# Patient Record
Sex: Male | Born: 1965 | Race: White | Hispanic: No | Marital: Married | Smoking: Never smoker
Health system: Southern US, Community
[De-identification: ages and names within clinical notes are randomized; demographics above are authoritative.]

## PROBLEM LIST (undated history)

## (undated) DIAGNOSIS — I5022 Chronic systolic (congestive) heart failure: Secondary | ICD-10-CM

## (undated) DIAGNOSIS — F32A Depression, unspecified: Secondary | ICD-10-CM

## (undated) DIAGNOSIS — C801 Malignant (primary) neoplasm, unspecified: Secondary | ICD-10-CM

## (undated) DIAGNOSIS — J449 Chronic obstructive pulmonary disease, unspecified: Secondary | ICD-10-CM

## (undated) DIAGNOSIS — G4730 Sleep apnea, unspecified: Secondary | ICD-10-CM

## (undated) DIAGNOSIS — I10 Essential (primary) hypertension: Secondary | ICD-10-CM

## (undated) DIAGNOSIS — E669 Obesity, unspecified: Secondary | ICD-10-CM

## (undated) DIAGNOSIS — I4891 Unspecified atrial fibrillation: Secondary | ICD-10-CM

## (undated) DIAGNOSIS — F329 Major depressive disorder, single episode, unspecified: Secondary | ICD-10-CM

## (undated) DIAGNOSIS — K602 Anal fissure, unspecified: Secondary | ICD-10-CM

## (undated) DIAGNOSIS — J45909 Unspecified asthma, uncomplicated: Secondary | ICD-10-CM

## (undated) DIAGNOSIS — Z8719 Personal history of other diseases of the digestive system: Secondary | ICD-10-CM

## (undated) DIAGNOSIS — F419 Anxiety disorder, unspecified: Secondary | ICD-10-CM

## (undated) DIAGNOSIS — K219 Gastro-esophageal reflux disease without esophagitis: Secondary | ICD-10-CM

## (undated) DIAGNOSIS — Z87442 Personal history of urinary calculi: Secondary | ICD-10-CM

## (undated) HISTORY — PX: CIRCUMCISION: SUR203

## (undated) HISTORY — PX: OTHER SURGICAL HISTORY: SHX169

## (undated) HISTORY — DX: Anal fissure, unspecified: K60.2

## (undated) HISTORY — PX: APPENDECTOMY: SHX54

## (undated) HISTORY — PX: DG ARTHRO THUMB*L*: HXRAD205

## (undated) HISTORY — DX: Major depressive disorder, single episode, unspecified: F32.9

## (undated) HISTORY — DX: Depression, unspecified: F32.A

## (undated) HISTORY — DX: Chronic systolic (congestive) heart failure: I50.22

## (undated) HISTORY — DX: Essential (primary) hypertension: I10

## (undated) HISTORY — DX: Obesity, unspecified: E66.9

---

## 2002-01-05 ENCOUNTER — Encounter: Payer: Self-pay | Admitting: Family Medicine

## 2002-01-05 ENCOUNTER — Ambulatory Visit (HOSPITAL_COMMUNITY): Admission: RE | Admit: 2002-01-05 | Discharge: 2002-01-05 | Payer: Self-pay | Admitting: Family Medicine

## 2002-02-17 ENCOUNTER — Inpatient Hospital Stay (HOSPITAL_COMMUNITY): Admission: RE | Admit: 2002-02-17 | Discharge: 2002-02-18 | Payer: Self-pay | Admitting: Neurosurgery

## 2002-02-17 ENCOUNTER — Encounter: Payer: Self-pay | Admitting: Neurosurgery

## 2003-10-11 ENCOUNTER — Ambulatory Visit (HOSPITAL_COMMUNITY): Admission: RE | Admit: 2003-10-11 | Discharge: 2003-10-11 | Payer: Self-pay | Admitting: Family Medicine

## 2003-12-27 ENCOUNTER — Emergency Department (HOSPITAL_COMMUNITY): Admission: EM | Admit: 2003-12-27 | Discharge: 2003-12-27 | Payer: Self-pay | Admitting: Emergency Medicine

## 2005-04-26 ENCOUNTER — Encounter (INDEPENDENT_AMBULATORY_CARE_PROVIDER_SITE_OTHER): Payer: Self-pay | Admitting: Specialist

## 2005-04-26 ENCOUNTER — Ambulatory Visit (HOSPITAL_BASED_OUTPATIENT_CLINIC_OR_DEPARTMENT_OTHER): Admission: RE | Admit: 2005-04-26 | Discharge: 2005-04-26 | Payer: Self-pay | Admitting: *Deleted

## 2006-05-21 ENCOUNTER — Ambulatory Visit: Payer: Self-pay | Admitting: Gastroenterology

## 2008-05-05 ENCOUNTER — Ambulatory Visit (HOSPITAL_COMMUNITY): Admission: RE | Admit: 2008-05-05 | Discharge: 2008-05-05 | Payer: Self-pay | Admitting: Family Medicine

## 2010-07-20 NOTE — Op Note (Signed)
[name], [name]                           ACCOUNT NO.:  [id number]   MEDICAL RECORD NO.:  [id number]                   PATIENT TYPE:  INP   LOCATION:  3012                                 FACILITY:  [facility]   PHYSICIAN:  Hewitt Shorts, M.D.            DATE OF BIRTH:  [birth year]   DATE OF PROCEDURE:  02/17/2002  DATE OF DISCHARGE:                                 OPERATIVE REPORT   PREOPERATIVE DIAGNOSES:  C5-6 cervical disk herniation, spondylosis,  degenerative disk disease, and radiculopathy.   POSTOPERATIVE DIAGNOSES:  C5-6 cervical disk herniation, spondylosis,  degenerative disk disease, and radiculopathy.   PROCEDURE:  C5-6 anterior cervical diskectomy and arthrodesis with allograft  and Trinica cervical plating.   SURGEON:  Hewitt Shorts, M.D.   ASSISTANT:  Coletta Memos, M.D.   ANESTHESIA:  General endotracheal.   INDICATIONS:  The patient is a 45 year old man who presented with neck pain  and radiculopathy.  He was found by MRI scan to have a C5-6 cervical disk  herniation superimposed upon an underlying spondylosis and degenerative disk  disease.  A decision was made to proceed with elective anterior cervical  diskectomy and arthrodesis.   DESCRIPTION OF PROCEDURE:  Patient brought to the operating room and placed  under general endotracheal anesthesia.  The patient was placed in 10 pounds  of Holter traction and the neck was prepped with Betadine soap and solution  and draped in a sterile fashion.  A horizontal incision was made in the left  side of the neck.  The line of the incision was infiltrated with local  anesthetic with epinephrine, and then an incision was made in a horizontal  fashion and carried down through the subcutaneous tissue and platysma.  Dissection was then carried out through an avascular plane, leaving the  sternocleidomastoid, carotid artery, and jugular vein laterally and the  trachea and esophagus medially.  The ventral aspect  of the vertebral column  was identified and a localizing x-ray was taken and the C5-6 intervertebral  disk space identified.  Diskectomy was begun with incision of the annulus  and continued with microcurettes and pituitary rongeurs.  The cartilaginous  end plates of the corresponding vertebrae were removed using microcurettes  as well as the Micro-Max drill.  The microscope was then draped and brought  into the field to provide additional magnification, illumination, and  visualization, and the remainder of the procedure was performed using  microdissection and microsurgical technique.  Posterior osteophytic  overgrowth was removed using the Micro-Max drill as well as the 2 mm  Kerrison punch with the thin foot plate.  The posterior longitudinal  ligament was opened and the disk herniation encountered and was removed.  Decompression was extended laterally into the foramina and the thecal sac  and nerve roots were decompressed bilaterally.  Once the diskectomy was  completed, hemostasis was established with the use of Gelfoam soaked in  thrombin, and then we proceeded with the arthrodesis.  We selected an 8 mm  graft of unicortical patellar allograft.  It was positioned in the  intervertebral disk space and counter sunk.  The cervical traction was  discontinued, and then we selected a 22 mm Trinica cervical plate.  It was  positioned over the fusion construct and secured to each of the vertebrae  with a pair of 4.2 x 14 mm screws.  Each screw hole was drilled and tapped  and the screws placed.  They were placed in alternating fashion.  They were  all fully tightened, and then the locking system was secured.  The wound was  irrigated with bacitracin solution and checked for hemostasis, which was  established and confirmed.  An x-ray was taken, which showed the graft,  plate, and screws to be in good position, and then we proceeded with  closure.  The platysma was closed with interrupted,  inverted 2-0 undyed  Vicryl suture, the subcutaneous and subcuticular layer were closed with  interrupted, inverted 3-0 undyed Vicryl sutures, and the skin was  reapproximated with Dermabond.  The patient tolerated the procedure well.  The estimated blood loss was 75 cc.  Sponge, instrument, and needle count  were correct.  Following surgery, the patient was placed in a soft cervical  collar, reversed from the anesthetic, to be extubated and transferred to  recovery for further care.                                               Hewitt Shorts, M.D.    RWN/MEDQ  D:  02/17/2002  T:  02/18/2002  Job:  [id number]

## 2010-07-20 NOTE — Op Note (Signed)
[name], [name]               ACCOUNT NO.:  [id number]   MEDICAL RECORD NO.:  [id number]          PATIENT TYPE:  AMB   LOCATION:  DSC                          FACILITY:  [facility]   PHYSICIAN:  Tennis Must Meyerdierks, M.D.DATE OF BIRTH:  [birth year]   DATE OF PROCEDURE:  04/26/2005  DATE OF DISCHARGE:                                 OPERATIVE REPORT   PREOPERATIVE DIAGNOSIS:  Mass left thumb.   POSTOPERATIVE DIAGNOSIS:  Mass left thumb.   PROCEDURE:  Excision of mass left thumb.   SURGEON:  Lowell Bouton, M.D.   ANESTHESIA:  General.   OPERATIVE FINDINGS:  The patient had a very large mass that involved the  entire pulp of the thumb.  It was adherent to the periosteum but did not  invade the bone.  The radial digital nerve was encased in the mass.  Ulnar  digital nerve was not involved.   PROCEDURE:  Under general anesthesia with a tourniquet on the left arm, the  left hand was prepped and draped in usual fashion and after exsanguinating  the limb, the tourniquet was inflated to 250 mmHg.  A V-shaped incision was  made over the pulp of the thumb and was radially based.  Sharp dissection  was carried down through the subcutaneous tissues to the mass.  The mass was  bluntly dissected out and was found to be adherent to the distal phalanx.  It was completely excised and the radial digital nerve appeared to enter the  mass.  It was beyond the trifurcation, however.  Ulnar digital nerve was not  involved and the tendon was not involved. After completely excising the  mass, there was a soft tissue defect in the thumb.  This was irrigated with  saline and the skin was closed with 4-0 nylon sutures.  0.5% Marcaine  digital block was inserted for pain control.  Sterile dressings were  applied.  The patient went to the recovery room awake and stable condition.      Lowell Bouton, M.D.  Electronically Signed     EMM/MEDQ  D:  04/26/2005  T:  04/26/2005   Job:  [id number]

## 2011-10-18 ENCOUNTER — Encounter: Payer: Self-pay | Admitting: Urgent Care

## 2011-10-18 ENCOUNTER — Ambulatory Visit (INDEPENDENT_AMBULATORY_CARE_PROVIDER_SITE_OTHER): Payer: Managed Care, Other (non HMO) | Admitting: Urgent Care

## 2011-10-18 VITALS — BP 121/79 | HR 68 | Temp 97.3°F | Ht 75.0 in | Wt 266.0 lb

## 2011-10-18 DIAGNOSIS — K921 Melena: Secondary | ICD-10-CM | POA: Insufficient documentation

## 2011-10-18 DIAGNOSIS — K6289 Other specified diseases of anus and rectum: Secondary | ICD-10-CM | POA: Insufficient documentation

## 2011-10-18 MED ORDER — PEG-KCL-NACL-NASULF-NA ASC-C 100 G PO SOLR: 1.0000 | ORAL | Status: DC

## 2011-10-18 NOTE — Progress Notes (Signed)
Referring Provider: Dr Phillips Odor Primary Care Physician:  Kirk Ruths, MD Primary Gastroenterologist:  Dr. Jonette Eva  Chief Complaint  Patient presents with  . Rectal Pain    HPI:  [name] is a 46 y.o. male here as a referral from Dr. Phillips Odor for Proctalgia. He was initially evaluated by Dr. Duncan Dull fields in 2008. At that point time is felt to have an anal fissure and treated with Anusol and sitz baths, as was given treatment for his constipation.  He tells me that his large stools & constipation seem to have resolved, however he still has severe pressure & proctalgia.  He has been having sharp pain if he sits for a long period of time.  The pain is relieved by getting up.  C/o 1 episode bright red blood in stool after straining with a bowel movement.   Denies mucus in his stools.  Denies any upper GI symptoms including heartburn, indigestion, nausea, vomiting, dysphagia, odynophagia or anorexia.  He has noticed some ulcers in his mouth .  Weight stable.  He takes a rare Naproxen for right arm pain.  He's never had colonoscopy.  Past Medical History  Diagnosis Date  . Hypertension   . Depression   . Anal fissure     Past Surgical History  Procedure Date  . Appendectomy   . Circumcision     Current Outpatient Prescriptions  Medication Sig Dispense Refill  . ALPRAZolam (XANAX) 0.5 MG tablet Take 0.5 mg by mouth at bedtime as needed.      Marland Kitchen aspirin 81 MG tablet Take 81 mg by mouth daily.      Marland Kitchen lisinopril (PRINIVIL,ZESTRIL) 10 MG tablet Take 10 mg by mouth daily.       . naproxen (NAPROSYN) 500 MG tablet Take 500 mg by mouth as needed.        Allergies as of 10/18/2011  . (No Known Allergies)    Family History  Problem Relation Age of Onset  . Liver disease Brother     ? etiology    History   Social History  . Marital Status: Married    Spouse Name: N/A    Number of Children: 2  . Years of Education: N/A   Occupational History  . Manufacturer    Social  History Main Topics  . Smoking status: Never Smoker   . Smokeless tobacco: Not on file  . Alcohol Use: Yes     socially, coupled rinks/yr  . Drug Use: No  . Sexually Active: Not on file   Review of Systems: Gen: Denies any fever, chills, sweats, anorexia, fatigue, weakness, malaise, weight loss, and sleep disorder CV: Denies chest pain, angina, palpitations, syncope, orthopnea, PND, peripheral edema, and claudication. Resp: Denies dyspnea at rest, dyspnea with exercise, cough, sputum, wheezing, coughing up blood, and pleurisy. GI: Denies vomiting blood, jaundice, and fecal incontinence.    GU : Denies urinary burning, blood in urine, urinary frequency, urinary hesitancy, nocturnal urination, and urinary incontinence. MS: Denies joint pain, limitation of movement, and swelling, stiffness, low back pain, extremity pain. Denies muscle weakness, cramps, atrophy.  Derm: Denies rash, itching, dry skin, hives, moles, warts, or unhealing ulcers.  Psych: Denies depression, anxiety, memory loss, suicidal ideation, hallucinations, paranoia, and confusion. Heme: Denies bruising, bleeding, and enlarged lymph nodes. Neuro:  Denies any headaches, dizziness, paresthesias. Endo:  Denies any problems with DM, thyroid, adrenal function.  Physical Exam: BP 121/79  Pulse 68  Temp 97.3 F (36.3 C) (Temporal)  Ht  6\' 3"  (1.905 m)  Wt 266 lb (120.657 kg)  BMI 33.25 kg/m2 No LMP for male patient. General:   Alert,  Well-developed, obese, pleasant and cooperative in NAD. Head:  Normocephalic and atraumatic. Eyes:  Sclera clear, no icterus.   Conjunctiva pink. Ears:  Normal auditory acuity. Nose:  No deformity, discharge, or lesions. Mouth:  No deformity or lesions,oropharynx pink & moist. Neck:  Supple; no masses or thyromegaly. Lungs:  Clear throughout to auscultation.   No wheezes, crackles, or rhonchi. No acute distress. Heart:  Regular rate and rhythm; no murmurs, clicks, rubs,  or gallops. Abdomen:   Normal bowel sounds.  No bruits.  Soft, non-tender and non-distended without masses, hepatosplenomegaly or hernias noted.  No guarding or rebound tenderness.   Rectal:  Deferred until colonoscopy. Msk:  Symmetrical without gross deformities. Normal posture. Pulses:  Normal pulses noted. Extremities:  No clubbing or edema. Neurologic:  Alert and oriented x4;  grossly normal neurologically. Skin:  Intact without significant lesions or rashes. Lymph Nodes:  No significant cervical adenopathy. Psych:  Alert and cooperative. Normal mood and affect.

## 2011-10-18 NOTE — Assessment & Plan Note (Signed)
[name] is a pleasant 46 y.o. 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  Constipation has resolved however he continues to have pain with defecation and has new onset hematochezia. Colonoscopy for evaluation to determine etiology of pain and bleeding. Differentials include benign anorectal source such as hemorrhoids or fissure, IBD, polyp or cancer.

## 2011-10-18 NOTE — Progress Notes (Signed)
Faxed to PCP

## 2011-10-18 NOTE — Patient Instructions (Addendum)
Colonoscopy with Dr Fields  

## 2011-10-18 NOTE — Assessment & Plan Note (Signed)
See proctalgia

## 2011-11-07 ENCOUNTER — Encounter (HOSPITAL_COMMUNITY): Payer: Self-pay | Admitting: Pharmacy Technician

## 2011-11-21 ENCOUNTER — Telehealth: Payer: Self-pay | Admitting: Gastroenterology

## 2011-11-21 ENCOUNTER — Ambulatory Visit (HOSPITAL_COMMUNITY)
Admission: RE | Admit: 2011-11-21 | Discharge: 2011-11-21 | Disposition: A | Discharge status: Home / Self Care | Payer: Managed Care, Other (non HMO) | Source: Ambulatory Visit | Attending: Gastroenterology | Admitting: Gastroenterology

## 2011-11-21 ENCOUNTER — Other Ambulatory Visit: Payer: Self-pay | Admitting: Gastroenterology

## 2011-11-21 ENCOUNTER — Encounter (HOSPITAL_COMMUNITY)
Admission: RE | Disposition: A | Discharge status: Home / Self Care | Payer: Self-pay | Source: Ambulatory Visit | Attending: Gastroenterology

## 2011-11-21 ENCOUNTER — Encounter (HOSPITAL_COMMUNITY): Payer: Self-pay | Admitting: *Deleted

## 2011-11-21 DIAGNOSIS — K644 Residual hemorrhoidal skin tags: Secondary | ICD-10-CM | POA: Insufficient documentation

## 2011-11-21 DIAGNOSIS — K625 Hemorrhage of anus and rectum: Secondary | ICD-10-CM

## 2011-11-21 DIAGNOSIS — Z5309 Procedure and treatment not carried out because of other contraindication: Secondary | ICD-10-CM | POA: Insufficient documentation

## 2011-11-21 DIAGNOSIS — K649 Unspecified hemorrhoids: Secondary | ICD-10-CM

## 2011-11-21 DIAGNOSIS — K6289 Other specified diseases of anus and rectum: Secondary | ICD-10-CM

## 2011-11-21 DIAGNOSIS — K921 Melena: Secondary | ICD-10-CM

## 2011-11-21 DIAGNOSIS — K5730 Diverticulosis of large intestine without perforation or abscess without bleeding: Secondary | ICD-10-CM | POA: Insufficient documentation

## 2011-11-21 DIAGNOSIS — I10 Essential (primary) hypertension: Secondary | ICD-10-CM | POA: Insufficient documentation

## 2011-11-21 HISTORY — PX: COLONOSCOPY: SHX5424

## 2011-11-21 LAB — BASIC METABOLIC PANEL
BUN: 10 mg/dL (ref 6–23)
CO2: 27 mEq/L (ref 19–32)
Calcium: 9.1 mg/dL (ref 8.4–10.5)
Chloride: 103 mEq/L (ref 96–112)
Creatinine, Ser: 0.93 mg/dL (ref 0.50–1.35)
GFR calc Af Amer: >90 mL/min (ref >90)
GFR calc non Af Amer: >90 mL/min (ref >90)
Glucose, Bld: 101 mg/dL — ABNORMAL HIGH (ref 70–99)
Potassium: 4.4 mEq/L (ref 3.5–5.1)
Sodium: 137 mEq/L (ref 135–145)

## 2011-11-21 LAB — MAGNESIUM: Magnesium: 2.1 mg/dL (ref 1.5–2.5)

## 2011-11-21 SURGERY — COLONOSCOPY
Anesthesia: Moderate Sedation

## 2011-11-21 MED ORDER — FLUMAZENIL 0.5 MG/5ML IV SOLN
INTRAVENOUS | Status: DC | PRN
  Administered 2011-11-21: .5 mg via INTRAVENOUS

## 2011-11-21 MED ORDER — MIDAZOLAM HCL 5 MG/5ML IJ SOLN
INTRAMUSCULAR | Status: AC
  Filled 2011-11-21: qty 10

## 2011-11-21 MED ORDER — FLUMAZENIL 0.5 MG/5ML IV SOLN
INTRAVENOUS | Status: AC
  Filled 2011-11-21: qty 5

## 2011-11-21 MED ORDER — ATROPINE SULFATE 1 MG/ML IJ SOLN
INTRAMUSCULAR | Status: AC
  Filled 2011-11-21: qty 1

## 2011-11-21 MED ORDER — MEPERIDINE HCL 100 MG/ML IJ SOLN
INTRAMUSCULAR | Status: AC
  Filled 2011-11-21: qty 1

## 2011-11-21 MED ORDER — MEPERIDINE HCL 100 MG/ML IJ SOLN
INTRAMUSCULAR | Status: DC | PRN
  Administered 2011-11-21: 50 mg via INTRAVENOUS
  Administered 2011-11-21 (×2): 25 mg via INTRAVENOUS
  Administered 2011-11-21: 50 mg via INTRAVENOUS

## 2011-11-21 MED ORDER — SODIUM CHLORIDE 0.45 % IV SOLN
INTRAVENOUS | Status: DC
  Administered 2011-11-21: 1000 mL via INTRAVENOUS

## 2011-11-21 MED ORDER — STERILE WATER FOR IRRIGATION IR SOLN
Status: DC | PRN
  Administered 2011-11-21: 10:00:00

## 2011-11-21 MED ORDER — PEG-KCL-NACL-NASULF-NA ASC-C 100 G PO SOLR: 1.0000 | ORAL | Status: DC

## 2011-11-21 MED ORDER — MIDAZOLAM HCL 5 MG/5ML IJ SOLN
INTRAMUSCULAR | Status: DC | PRN
  Administered 2011-11-21 (×2): 1 mg via INTRAVENOUS
  Administered 2011-11-21 (×2): 2 mg via INTRAVENOUS

## 2011-11-21 MED ORDER — ATROPINE SULFATE 1 MG/ML IJ SOLN
INTRAMUSCULAR | Status: DC | PRN
  Administered 2011-11-21: 1 mg via INTRAVENOUS

## 2011-11-21 NOTE — Telephone Encounter (Signed)
Message copied by Glendora Score on Thu Nov 21, 2011  2:08 PM ------      Message from: [place]      Created: Thu Nov 21, 2011 10:25 AM       PT NEEDS TCS WITH PROPOFOL DUE TO RECTAL BLEEDING-FAILED CONSCIOUS SEDATION, HR 30S REQUIRING ATROPINE AND NARCAN

## 2011-11-21 NOTE — Telephone Encounter (Signed)
Patient is rescheduled for TCS w/Propofol on Tuesday Oct 8th and instructions have been mailed to the patient and he is aware

## 2011-11-21 NOTE — Telephone Encounter (Signed)
REVIEWED.  

## 2011-11-21 NOTE — H&P (Signed)
`   Primary Care Physician:  MCGOUGH,WILLIAM M, MD Primary Gastroenterologist:  Dr. Deja Pisarski  Pre-Procedure History & Physical: HPI:  [name] is a 46 y.o. male here for  BRBPR.    Past Medical History  Diagnosis Date  . Hypertension   . Depression   . Anal fissure     Past Surgical History  Procedure Date  . Appendectomy   . Circumcision   . Anterior cervical disectomy     Prior to Admission medications   Medication Sig Start Date End Date Taking? Authorizing Provider  ALPRAZolam (XANAX) 0.5 MG tablet Take 0.5 mg by mouth at bedtime as needed. For sleep   Yes Historical Provider, MD  aspirin 81 MG tablet Take 81 mg by mouth daily.   Yes Historical Provider, MD  lisinopril (PRINIVIL,ZESTRIL) 10 MG tablet Take 10 mg by mouth daily.  10/13/11  Yes Historical Provider, MD  naproxen (NAPROSYN) 500 MG tablet Take 500 mg by mouth daily as needed. For inflammation   Yes Historical Provider, MD    Allergies as of 10/18/2011  . (No Known Allergies)    Family History  Problem Relation Age of Onset  . Liver disease Brother     ? etiology    History   Social History  . Marital Status: Married    Spouse Name: N/A    Number of Children: 2  . Years of Education: N/A   Occupational History  . Manufacturer    Social History Main Topics  . Smoking status: Never Smoker   . Smokeless tobacco: Not on file  . Alcohol Use: Yes     socially, coupled rinks/yr  . Drug Use: No  . Sexually Active: Not on file   Other Topics Concern  . Not on file   Social History Narrative  . No narrative on file    Review of Systems: See HPI, otherwise negative ROS   Physical Exam: BP 131/82  Pulse 57  Temp 98.6 F (37 C) (Oral)  Resp 13  Ht 6' 3" (1.905 m)  Wt 264 lb (119.75 kg)  BMI 33.00 kg/m2  SpO2 96% General:   Alert,  pleasant and cooperative in NAD Head:  Normocephalic and atraumatic. Neck:  Supple; Lungs:  Clear throughout to auscultation.    Heart:  Regular rate  and rhythm. Abdomen:  Soft, nontender and nondistended. Normal bowel sounds, without guarding, and without rebound.   Neurologic:  Alert and  oriented x4;  grossly normal neurologically.  Impression/Plan:    BRBPR  PLAN: TCS TODAY  

## 2011-11-21 NOTE — Op Note (Addendum)
[facility] [address] [place], [address]   COLONOSCOPY PROCEDURE REPORT  PATIENT: [name], [name]  MR#: [id number] BIRTHDATE: 1965-06-01 , 45  yrs. old GENDER: Male ENDOSCOPIST: Jonette Eva, MD REFERRED [id number] Regino Schultze, M.D. PROCEDURE DATE:  11/21/2011 PROCEDURE:   Colonoscopy, incomplete INDICATIONS:rectal bleeding. MEDICATIONS: Demerol 150 mg IV, Versed 6 mg IV, Atropine 1 mg IV, and Naloxone (Narcan) 0.4 mg IV  DESCRIPTION OF PROCEDURE:    Physical exam was performed.  Informed consent was obtained from the patient after explaining the benefits, risks, and alternatives to procedure.  The patient was connected to monitor and placed in left lateral position. Continuous oxygen was provided by nasal cannula and IV medicine administered through an indwelling cannula.  After administration of sedation and rectal exam, the patients rectum was intubated and the Pentax Colonoscope [phone]  colonoscope was advanced under direct visualization to the MID-TRANSVERSE COLON.  The scope was removed slowly by carefully examining the color, texture, anatomy, and integrity mucosa on the way out.  The patient was recovered in endoscopy and discharged home in satisfactory condition.       COLON FINDINGS: Mild diverticulosis was noted in the sigmoid colon. , The colon mucosa was otherwise normal.  , and Moderate sized hemorrhoids were found.  PREP QUALITY: excellent. CECAL W/D TIME:  COMPLICATIONS: None  ENDOSCOPIC IMPRESSION: 1.   Mild diverticulosis was noted in the sigmoid colon 2.   The colon mucosa was otherwise normal 3.   Moderate sized hemorrhoids-MOST LIKELY CAUSE FOR RECTAL BLEEDING   RECOMMENDATIONS: 1.  HIGH FIBER DIET PREP H PRN BMP/MG TODAY TCS IN WITIHN1 MONTH WITH PROPOFOL.  PT FAILED CONSCIOUS SEDATION DUE TO SEVERE SINUS BRADYCARDIA. 2.  HIGH FIBER DIET PREP H PRN BMP/MG TODAY TCS IN WITIHN1 MONTH WITH PROPOFOL.  PT FAILED CONSCIOUS  SEDATION DUE TO SEVER SINSU BRADYCARDIA.   LABS: SEP 2013 K 4.4 MG 2.1 CR 0.93    _______________________________ Rosalie DoctorJonette Eva, MD 11/21/2011 11:52 AM Revised: 11/21/2011 11:52 AM    PATIENT NAME:  [name], [name] MR#: [id number]

## 2011-11-21 NOTE — Progress Notes (Signed)
Dr Darrick Penna in to assess pt. States lab work is normal. Pt can be discharged now.

## 2011-11-25 ENCOUNTER — Encounter (HOSPITAL_COMMUNITY): Payer: Self-pay | Admitting: Gastroenterology

## 2011-12-02 ENCOUNTER — Encounter (HOSPITAL_COMMUNITY)
Admission: RE | Admit: 2011-12-02 | Discharge: 2011-12-02 | Disposition: A | Discharge status: Home / Self Care | Payer: Managed Care, Other (non HMO) | Source: Ambulatory Visit | Attending: Gastroenterology | Admitting: Gastroenterology

## 2011-12-02 ENCOUNTER — Encounter (HOSPITAL_COMMUNITY): Payer: Self-pay

## 2011-12-02 ENCOUNTER — Encounter (HOSPITAL_COMMUNITY): Payer: Self-pay | Admitting: Pharmacy Technician

## 2011-12-02 ENCOUNTER — Other Ambulatory Visit: Payer: Self-pay

## 2011-12-02 HISTORY — DX: Personal history of other diseases of the digestive system: Z87.19

## 2011-12-02 HISTORY — DX: Gastro-esophageal reflux disease without esophagitis: K21.9

## 2011-12-02 HISTORY — DX: Anxiety disorder, unspecified: F41.9

## 2011-12-02 LAB — HEMOGLOBIN AND HEMATOCRIT, BLOOD
HCT: 41.5 % (ref 39.0–52.0)
Hemoglobin: 14.1 g/dL (ref 13.0–17.0)

## 2011-12-02 NOTE — Patient Instructions (Addendum)
20 [name]  12/02/2011   Your procedure is scheduled on:   12/10/2011  Report to [facility] at  615  AM.  Call this number if you have problems the morning of surgery: [phone]   Remember:   Do not eat food:After Midnight.  May have clear liquids:until Midnight .    Take these medicines the morning of surgery with A SIP OF WATER:  Xanax,lisinopril   Do not wear jewelry, make-up or nail polish.  Do not wear lotions, powders, or perfumes. You may wear deodorant.  Do not shave 48 hours prior to surgery. Men may shave face and neck.  Do not bring valuables to the hospital.  Contacts, dentures or bridgework may not be worn into surgery.  Leave suitcase in the car. After surgery it may be brought to your room.  For patients admitted to the hospital, checkout time is 11:00 AM the day of discharge.   Patients discharged the day of surgery will not be allowed to drive home.  Name and phone number of your driver: family  Special Instructions: N/A   Please read over the following fact sheets that you were given: Pain Booklet, MRSA Information, Surgical Site Infection Prevention, Anesthesia Post-op Instructions and Care and Recovery After Surgery  Colonoscopy A colonoscopy is an exam to evaluate your entire colon. In this exam, your colon is cleansed. A long fiberoptic tube is inserted through your rectum and into your colon. The fiberoptic scope (endoscope) is a long bundle of enclosed and very flexible fibers. These fibers transmit light to the area examined and send images from that area to your caregiver. Discomfort is usually minimal. You may be given a drug to help you sleep (sedative) during or prior to the procedure. This exam helps to detect lumps (tumors), polyps, inflammation, and areas of bleeding. Your caregiver may also take a small piece of tissue (biopsy) that will be examined under a microscope. LET YOUR CAREGIVER KNOW ABOUT:   Allergies to food or medicine.    Medicines taken, including vitamins, herbs, eyedrops, over-the-counter medicines, and creams.   Use of steroids (by mouth or creams).   Previous problems with anesthetics or numbing medicines.   History of bleeding problems or blood clots.   Previous surgery.   Other health problems, including diabetes and kidney problems.   Possibility of pregnancy, if this applies.  BEFORE THE PROCEDURE   A clear liquid diet may be required for 2 days before the exam.   Ask your caregiver about changing or stopping your regular medications.   Liquid injections (enemas) or laxatives may be required.   A large amount of electrolyte solution may be given to you to drink over a short period of time. This solution is used to clean out your colon.   You should be present 60 minutes prior to your procedure or as directed by your caregiver.  AFTER THE PROCEDURE   If you received a sedative or pain relieving medication, you will need to arrange for someone to drive you home.   Occasionally, there is a little blood passed with the first bowel movement. Do not be concerned.  FINDING OUT THE RESULTS OF YOUR TEST Not all test results are available during your visit. If your test results are not back during the visit, make an appointment with your caregiver to find out the results. Do not assume everything is normal if you have not heard from your caregiver or the medical facility. It is  important for you to follow up on all of your test results. HOME CARE INSTRUCTIONS   It is not unusual to pass moderate amounts of gas and experience mild abdominal cramping following the procedure. This is due to air being used to inflate your colon during the exam. Walking or a warm pack on your belly (abdomen) may help.   You may resume all normal meals and activities after sedatives and medicines have worn off.   Only take over-the-counter or prescription medicines for pain, discomfort, or fever as directed by your  caregiver. Do not use aspirin or blood thinners if a biopsy was taken. Consult your caregiver for medicine usage if biopsies were taken.  SEEK IMMEDIATE MEDICAL CARE IF:   You have a fever.   You pass large blood clots or fill a toilet with blood following the procedure. This may also occur 10 to 14 days following the procedure. This is more likely if a biopsy was taken.   You develop abdominal pain that keeps getting worse and cannot be relieved with medicine.  Document Released: 02/16/2000 Document Revised: 02/07/2011 Document Reviewed: 10/01/2007 [facility] Patient Information 2012 [place], [place].PATIENT INSTRUCTIONS POST-ANESTHESIA  IMMEDIATELY FOLLOWING SURGERY:  Do not drive or operate machinery for the first twenty four hours after surgery.  Do not make any important decisions for twenty four hours after surgery or while taking narcotic pain medications or sedatives.  If you develop intractable nausea and vomiting or a severe headache please notify your doctor immediately.  FOLLOW-UP:  Please make an appointment with your surgeon as instructed. You do not need to follow up with anesthesia unless specifically instructed to do so.  WOUND CARE INSTRUCTIONS (if applicable):  Keep a dry clean dressing on the anesthesia/puncture wound site if there is drainage.  Once the wound has quit draining you may leave it open to air.  Generally you should leave the bandage intact for twenty four hours unless there is drainage.  If the epidural site drains for more than 36-48 hours please call the anesthesia department.  QUESTIONS?:  Please feel free to call your physician or the hospital operator if you have any questions, and they will be happy to assist you.

## 2011-12-04 ENCOUNTER — Other Ambulatory Visit (HOSPITAL_COMMUNITY): Payer: Managed Care, Other (non HMO)

## 2011-12-10 ENCOUNTER — Ambulatory Visit (HOSPITAL_COMMUNITY): Payer: Managed Care, Other (non HMO) | Admitting: Anesthesiology

## 2011-12-10 ENCOUNTER — Encounter (HOSPITAL_COMMUNITY)
Admission: RE | Disposition: A | Discharge status: Home / Self Care | Payer: Self-pay | Source: Ambulatory Visit | Attending: Gastroenterology

## 2011-12-10 ENCOUNTER — Ambulatory Visit (HOSPITAL_COMMUNITY)
Admission: RE | Admit: 2011-12-10 | Discharge: 2011-12-10 | Disposition: A | Discharge status: Home / Self Care | Payer: Managed Care, Other (non HMO) | Source: Ambulatory Visit | Attending: Gastroenterology | Admitting: Gastroenterology

## 2011-12-10 ENCOUNTER — Encounter (HOSPITAL_COMMUNITY): Payer: Self-pay | Admitting: *Deleted

## 2011-12-10 ENCOUNTER — Encounter (HOSPITAL_COMMUNITY): Payer: Self-pay | Admitting: Anesthesiology

## 2011-12-10 DIAGNOSIS — K648 Other hemorrhoids: Secondary | ICD-10-CM | POA: Insufficient documentation

## 2011-12-10 DIAGNOSIS — Z79899 Other long term (current) drug therapy: Secondary | ICD-10-CM | POA: Insufficient documentation

## 2011-12-10 DIAGNOSIS — K625 Hemorrhage of anus and rectum: Secondary | ICD-10-CM

## 2011-12-10 DIAGNOSIS — I10 Essential (primary) hypertension: Secondary | ICD-10-CM | POA: Insufficient documentation

## 2011-12-10 DIAGNOSIS — Z01810 Encounter for preprocedural cardiovascular examination: Secondary | ICD-10-CM | POA: Insufficient documentation

## 2011-12-10 DIAGNOSIS — Z01812 Encounter for preprocedural laboratory examination: Secondary | ICD-10-CM | POA: Insufficient documentation

## 2011-12-10 SURGERY — COLONOSCOPY WITH PROPOFOL
Anesthesia: Monitor Anesthesia Care

## 2011-12-10 MED ORDER — LIDOCAINE HCL (PF) 1 % IJ SOLN
INTRAMUSCULAR | Status: AC
  Filled 2011-12-10: qty 5

## 2011-12-10 MED ORDER — MIDAZOLAM HCL 5 MG/5ML IJ SOLN
INTRAMUSCULAR | Status: DC | PRN
  Administered 2011-12-10: 2 mg via INTRAVENOUS

## 2011-12-10 MED ORDER — PROPOFOL 10 MG/ML IV EMUL
INTRAVENOUS | Status: AC
  Filled 2011-12-10: qty 20

## 2011-12-10 MED ORDER — MIDAZOLAM HCL 2 MG/2ML IJ SOLN
INTRAMUSCULAR | Status: AC
  Filled 2011-12-10: qty 2

## 2011-12-10 MED ORDER — WATER FOR IRRIGATION, STERILE IR SOLN
Status: DC | PRN
  Administered 2011-12-10: 1000 mL

## 2011-12-10 MED ORDER — GLYCOPYRROLATE 0.2 MG/ML IJ SOLN
0.4000 mg | Freq: Once | INTRAMUSCULAR | Status: AC
  Administered 2011-12-10: 0.4 mg via INTRAVENOUS

## 2011-12-10 MED ORDER — ONDANSETRON HCL 4 MG/2ML IJ SOLN: 4.0000 mg | Freq: Once | INTRAMUSCULAR | Status: DC | PRN

## 2011-12-10 MED ORDER — HYDROCORTISONE ACETATE 25 MG RE SUPP: 25.0000 mg | Freq: Two times a day (BID) | RECTAL | Status: DC

## 2011-12-10 MED ORDER — LACTATED RINGERS IV SOLN: INTRAVENOUS | Status: DC

## 2011-12-10 MED ORDER — ATROPINE SULFATE 1 MG/ML IJ SOLN
INTRAMUSCULAR | Status: AC
  Filled 2011-12-10: qty 1

## 2011-12-10 MED ORDER — MIDAZOLAM HCL 2 MG/2ML IJ SOLN
1.0000 mg | INTRAMUSCULAR | Status: DC | PRN
  Administered 2011-12-10: 2 mg via INTRAVENOUS

## 2011-12-10 MED ORDER — ATROPINE SULFATE 0.4 MG/ML IJ SOLN
INTRAMUSCULAR | Status: DC | PRN
  Administered 2011-12-10: 1 mg via INTRAVENOUS

## 2011-12-10 MED ORDER — FENTANYL CITRATE 0.05 MG/ML IJ SOLN
INTRAMUSCULAR | Status: DC | PRN
  Administered 2011-12-10 (×4): 25 ug via INTRAVENOUS

## 2011-12-10 MED ORDER — STERILE WATER FOR IRRIGATION IR SOLN
Status: DC | PRN
  Administered 2011-12-10: 08:00:00

## 2011-12-10 MED ORDER — PROPOFOL INFUSION 10 MG/ML OPTIME
INTRAVENOUS | Status: DC | PRN
  Administered 2011-12-10: 75 ug/kg/min via INTRAVENOUS

## 2011-12-10 MED ORDER — LACTATED RINGERS IV SOLN
INTRAVENOUS | Status: DC | PRN
  Administered 2011-12-10: 07:00:00 via INTRAVENOUS

## 2011-12-10 MED ORDER — GLYCOPYRROLATE 0.2 MG/ML IJ SOLN
INTRAMUSCULAR | Status: AC
  Filled 2011-12-10: qty 1

## 2011-12-10 MED ORDER — FENTANYL CITRATE 0.05 MG/ML IJ SOLN
INTRAMUSCULAR | Status: AC
  Filled 2011-12-10: qty 2

## 2011-12-10 MED ORDER — ONDANSETRON HCL 4 MG/2ML IJ SOLN
INTRAMUSCULAR | Status: AC
  Filled 2011-12-10: qty 2

## 2011-12-10 MED ORDER — ONDANSETRON HCL 4 MG/2ML IJ SOLN
4.0000 mg | Freq: Once | INTRAMUSCULAR | Status: AC
  Administered 2011-12-10: 4 mg via INTRAVENOUS

## 2011-12-10 MED ORDER — FENTANYL CITRATE 0.05 MG/ML IJ SOLN: 25.0000 ug | INTRAMUSCULAR | Status: DC | PRN

## 2011-12-10 MED ORDER — LACTATED RINGERS IV SOLN
INTRAVENOUS | Status: DC
  Administered 2011-12-10: 50 mL via INTRAVENOUS

## 2011-12-10 MED ORDER — LIDOCAINE HCL (CARDIAC) 10 MG/ML IV SOLN
INTRAVENOUS | Status: DC | PRN
  Administered 2011-12-10: 50 mg via INTRAVENOUS

## 2011-12-10 SURGICAL SUPPLY — 21 items
ELECT REM PT RETURN 9FT ADLT (ELECTROSURGICAL) ×2
ELECTRODE REM PT RTRN 9FT ADLT (ELECTROSURGICAL) ×1 IMPLANT
FCP BXJMBJMB 240X2.8X (CUTTING FORCEPS)
FLOOR PAD 36X40 (MISCELLANEOUS) ×2
FORCEPS BIOP RAD 4 LRG CAP 4 (CUTTING FORCEPS) IMPLANT
FORCEPS BIOP RJ4 240 W/NDL (CUTTING FORCEPS)
FORCEPS BXJMBJMB 240X2.8X (CUTTING FORCEPS) IMPLANT
INJECTOR/SNARE I SNARE (MISCELLANEOUS) IMPLANT
LUBRICANT JELLY 4.5OZ STERILE (MISCELLANEOUS) ×2 IMPLANT
MANIFOLD NEPTUNE II (INSTRUMENTS) ×2 IMPLANT
NEEDLE SCLEROTHERAPY 25GX240 (NEEDLE) IMPLANT
PAD FLOOR 36X40 (MISCELLANEOUS) ×1 IMPLANT
PROBE APC STR FIRE (PROBE) IMPLANT
PROBE INJECTION GOLD (MISCELLANEOUS)
PROBE INJECTION GOLD 7FR (MISCELLANEOUS) IMPLANT
SNARE SHORT THROW 13M SML OVAL (MISCELLANEOUS) IMPLANT
SYR 50ML LL SCALE MARK (SYRINGE) ×2 IMPLANT
TRAP SPECIMEN MUCOUS 40CC (MISCELLANEOUS) IMPLANT
TUBING ENDO SMARTCAP PENTAX (MISCELLANEOUS) ×2 IMPLANT
TUBING IRRIGATION ENDOGATOR (MISCELLANEOUS) ×2 IMPLANT
WATER STERILE IRR 1000ML POUR (IV SOLUTION) ×4 IMPLANT

## 2011-12-10 NOTE — Interval H&P Note (Signed)
History and Physical Interval Note:  12/10/2011 7:48 AM  [name]  has presented today for surgery, with the diagnosis of Rectal Bleeding  The various methods of treatment have been discussed with the patient and family. After consideration of risks, benefits and other options for treatment, the patient has consented to  Procedure(s) (LRB) with comments: COLONOSCOPY WITH PROPOFOL (N/A) as a surgical intervention .  The patient's history has been reviewed, patient examined, no change in status, stable for surgery.  I have reviewed the patient's chart and labs.  Questions were answered to the patient's satisfaction.     [employer]

## 2011-12-10 NOTE — Anesthesia Procedure Notes (Signed)
Procedure Name: MAC Date/Time: 12/10/2011 8:00 AM Performed by: Carolyne Littles, Mithran Strike L Pre-anesthesia Checklist: Patient identified, Timeout performed, Emergency Drugs available, Suction available and Patient being monitored Patient Re-evaluated:Patient Re-evaluated prior to inductionOxygen Delivery Method: Simple face mask

## 2011-12-10 NOTE — Anesthesia Preprocedure Evaluation (Signed)
Anesthesia Evaluation  Patient identified by MRN, date of birth, ID band Patient awake    Reviewed: Allergy & Precautions, H&P , NPO status , Patient's Chart, lab work & pertinent test results  Airway Mallampati: I      Dental  (+) Teeth Intact   Pulmonary neg pulmonary ROS,  breath sounds clear to auscultation        Cardiovascular hypertension, Pt. on medications Rhythm:Regular Rate:Bradycardia     Neuro/Psych PSYCHIATRIC DISORDERS Anxiety Depression    GI/Hepatic hiatal hernia,   Endo/Other    Renal/GU      Musculoskeletal   Abdominal   Peds  Hematology   Anesthesia Other Findings   Reproductive/Obstetrics                           Anesthesia Physical Anesthesia Plan  ASA: II  Anesthesia Plan: MAC   Post-op Pain Management:    Induction: Intravenous  Airway Management Planned: Simple Face Mask  Additional Equipment:   Intra-op Plan:   Post-operative Plan:   Informed Consent: I have reviewed the patients History and Physical, chart, labs and discussed the procedure including the risks, benefits and alternatives for the proposed anesthesia with the patient or authorized representative who has indicated his/her understanding and acceptance.     Plan Discussed with:   Anesthesia Plan Comments:         Anesthesia Quick Evaluation

## 2011-12-10 NOTE — Op Note (Signed)
[facility] [address] [place], [address]   COLONOSCOPY PROCEDURE REPORT  PATIENT: Leandro, Hulburt  MR#: [id number] BIRTHDATE: [birth year] , 45  yrs. old GENDER: Male ENDOSCOPIST: Jonette Eva, MD REFERRED MV:HQIONGE Regino Schultze, M.D. PROCEDURE DATE:  12/10/2011 PROCEDURE:   Colonoscopy, screening INDICATIONS:rectal bleeding.  FREQUENTLY LIFTS HEAVY ITEMS AND DOES REPETATIVE TASKS. MEDICATIONS: MAC sedation, administered by CRNA and Atropine mg IV  DESCRIPTION OF PROCEDURE:    Physical exam was performed.  Informed consent was obtained from the patient after explaining the benefits, risks, and alternatives to procedure.  The patient was connected to monitor and placed in left lateral position. Continuous oxygen was provided by nasal cannula and IV medicine administered through an indwelling cannula.  After administration of sedation and rectal exam, the patients rectum was intubated and the     colonoscope was advanced under direct visualization to the cecum.  The scope was removed slowly by carefully examining the color, texture, anatomy, and integrity mucosa on the way out.  The patient was recovered in endoscopy and discharged home in satisfactory condition.       COLON FINDINGS: The colon was otherwise normal.  There was no diverticulosis, inflammation, polyps or cancers unless previously stated and Moderate sized internal hemorrhoids were found.  PREP QUALITY: good. CECAL W/D TIME: 14 minutes  COMPLICATIONS: None  ENDOSCOPIC IMPRESSION: 1.   The colon was otherwise normal 2.   Moderate sized internal hemorrhoids WHICH ARE THE CAUSE FOR RECTAL BLEEDING.   RECOMMENDATIONS: DRINK WATER TO KEEP URINE LIGHT YELLOW.  AVOID CONSTIPATION & HEAVY LIFTING.  FOLLOW A HIGH FIBER DIET.  AVOID ITEMS THAT CAUSE BLOATING.  USE PREPARATION H 2 TO 4 TIMES A DAY AS NEEDED FOR RECTAL PRESSURE/PAIN/BLEEDING.  USE COLACE AS NEEDED FOR HARD STOOL OR  CONSTIPATION.  Next colonoscopy in 10 years. WITH AN OVERTUBE AND PROPOFOL.       _______________________________ Rosalie DoctorJonette Eva, MD 12/10/2011 8:53 AM     PATIENT NAME:  Jaziyah, Fauble MR#: [id number]

## 2011-12-10 NOTE — Anesthesia Postprocedure Evaluation (Signed)
  Anesthesia Post-op Note  Patient: [name]  Procedure(s) Performed: Procedure(s) (LRB) with comments: COLONOSCOPY WITH PROPOFOL (N/A) - 0815 at cecum; withdrawal time=14 min  Patient Location: PACU  Anesthesia Type: MAC  Level of Consciousness: awake, alert , oriented and patient cooperative  Airway and Oxygen Therapy: Patient Spontanous Breathing and Patient connected to face mask oxygen  Post-op Pain: mild  Post-op Assessment: Post-op Vital signs reviewed, Patient's Cardiovascular Status Stable, Respiratory Function Stable, Patent Airway and No signs of Nausea or vomiting  Post-op Vital Signs: Reviewed and stable  Complications: No apparent anesthesia complications  

## 2011-12-10 NOTE — H&P (View-Only) (Signed)
`   Primary Care Physician:  Kirk Ruths, MD Primary Gastroenterologist:  Dr. Darrick Penna  Pre-Procedure History & Physical: HPI:  [name] is a 46 y.o. male here for  BRBPR.    Past Medical History  Diagnosis Date  . Hypertension   . Depression   . Anal fissure     Past Surgical History  Procedure Date  . Appendectomy   . Circumcision   . Anterior cervical disectomy     Prior to Admission medications   Medication Sig Start Date End Date Taking? Authorizing Provider  ALPRAZolam Prudy Feeler) 0.5 MG tablet Take 0.5 mg by mouth at bedtime as needed. For sleep   Yes Historical Provider, MD  aspirin 81 MG tablet Take 81 mg by mouth daily.   Yes Historical Provider, MD  lisinopril (PRINIVIL,ZESTRIL) 10 MG tablet Take 10 mg by mouth daily.  10/13/11  Yes Historical Provider, MD  naproxen (NAPROSYN) 500 MG tablet Take 500 mg by mouth daily as needed. For inflammation   Yes Historical Provider, MD    Allergies as of 10/18/2011  . (No Known Allergies)    Family History  Problem Relation Age of Onset  . Liver disease Brother     ? etiology    History   Social History  . Marital Status: Married    Spouse Name: N/A    Number of Children: 2  . Years of Education: N/A   Occupational History  . Manufacturer    Social History Main Topics  . Smoking status: Never Smoker   . Smokeless tobacco: Not on file  . Alcohol Use: Yes     socially, coupled rinks/yr  . Drug Use: No  . Sexually Active: Not on file   Other Topics Concern  . Not on file   Social History Narrative  . No narrative on file    Review of Systems: See HPI, otherwise negative ROS   Physical Exam: BP 131/82  Pulse 57  Temp 98.6 F (37 C) (Oral)  Resp 13  Ht 6\' 3"  (1.905 m)  Wt 264 lb (119.75 kg)  BMI 33.00 kg/m2  SpO2 96% General:   Alert,  pleasant and cooperative in NAD Head:  Normocephalic and atraumatic. Neck:  Supple; Lungs:  Clear throughout to auscultation.    Heart:  Regular rate  and rhythm. Abdomen:  Soft, nontender and nondistended. Normal bowel sounds, without guarding, and without rebound.   Neurologic:  Alert and  oriented x4;  grossly normal neurologically.  Impression/Plan:    BRBPR  PLAN: TCS TODAY

## 2011-12-10 NOTE — Transfer of Care (Signed)
  Anesthesia Post-op Note  Patient: [name]  Procedure(s) Performed: Procedure(s) (LRB) with comments: COLONOSCOPY WITH PROPOFOL (N/A) - 0815 at cecum; withdrawal time=14 min  Patient Location: PACU  Anesthesia Type: MAC  Level of Consciousness: awake, alert , oriented and patient cooperative  Airway and Oxygen Therapy: Patient Spontanous Breathing and Patient connected to face mask oxygen  Post-op Pain: mild  Post-op Assessment: Post-op Vital signs reviewed, Patient's Cardiovascular Status Stable, Respiratory Function Stable, Patent Airway and No signs of Nausea or vomiting  Post-op Vital Signs: Reviewed and stable  Complications: No apparent anesthesia complications

## 2011-12-10 NOTE — Preoperative (Signed)
Beta Blockers   Reason not to administer Beta Blockers:Not Applicable 

## 2011-12-15 NOTE — Progress Notes (Signed)
TCS SEP 2013 I[facility]   REVIEWED.  

## 2012-04-02 ENCOUNTER — Ambulatory Visit (INDEPENDENT_AMBULATORY_CARE_PROVIDER_SITE_OTHER): Payer: Managed Care, Other (non HMO) | Admitting: Otolaryngology

## 2012-04-02 DIAGNOSIS — J351 Hypertrophy of tonsils: Secondary | ICD-10-CM

## 2012-04-02 DIAGNOSIS — J3501 Chronic tonsillitis: Secondary | ICD-10-CM

## 2012-04-02 DIAGNOSIS — H6120 Impacted cerumen, unspecified ear: Secondary | ICD-10-CM

## 2012-11-17 ENCOUNTER — Ambulatory Visit: Payer: Managed Care, Other (non HMO) | Admitting: Cardiovascular Disease

## 2012-11-20 ENCOUNTER — Ambulatory Visit: Payer: Managed Care, Other (non HMO) | Admitting: Cardiovascular Disease

## 2013-02-17 ENCOUNTER — Ambulatory Visit: Payer: Managed Care, Other (non HMO) | Admitting: Gastroenterology

## 2013-04-07 ENCOUNTER — Encounter: Payer: Self-pay | Admitting: Gastroenterology

## 2013-04-07 ENCOUNTER — Encounter (INDEPENDENT_AMBULATORY_CARE_PROVIDER_SITE_OTHER): Payer: Self-pay

## 2013-04-07 ENCOUNTER — Encounter: Payer: Self-pay | Admitting: Internal Medicine

## 2013-04-07 ENCOUNTER — Ambulatory Visit (INDEPENDENT_AMBULATORY_CARE_PROVIDER_SITE_OTHER): Payer: Managed Care, Other (non HMO) | Admitting: Gastroenterology

## 2013-04-07 ENCOUNTER — Other Ambulatory Visit: Payer: Self-pay | Admitting: Gastroenterology

## 2013-04-07 VITALS — BP 116/71 | HR 55 | Temp 98.0°F | Wt 283.0 lb

## 2013-04-07 DIAGNOSIS — K6289 Other specified diseases of anus and rectum: Secondary | ICD-10-CM

## 2013-04-07 DIAGNOSIS — K921 Melena: Secondary | ICD-10-CM

## 2013-04-07 DIAGNOSIS — K625 Hemorrhage of anus and rectum: Secondary | ICD-10-CM

## 2013-04-07 DIAGNOSIS — K5900 Constipation, unspecified: Secondary | ICD-10-CM | POA: Insufficient documentation

## 2013-04-07 MED ORDER — LINACLOTIDE 145 MCG PO CAPS: ORAL_CAPSULE | ORAL | Status: DC

## 2013-04-07 NOTE — Progress Notes (Signed)
Pt is aware of OV on 3/4 at 245 with SF and appt card was mailed

## 2013-04-07 NOTE — Assessment & Plan Note (Signed)
DRINK WATER EAT FIBER ADD LINZESS OPV IN 4 MOS

## 2013-04-07 NOTE — Assessment & Plan Note (Signed)
DUE TO HEMORRHOIDS.  FLEXSIG/IH BANDING FEB 6

## 2013-04-07 NOTE — Progress Notes (Signed)
   Subjective:    Patient ID: [name], male    DOB: March 11, 1965, 48 y.o.   MRN: [id number]  Leonides Grills, MD  HPI Has seen brbpr off and on. EASES UP AND THEN COMES BACK. TAKING STOOL SOFTENERS(EQUATE). MAY FEEL SOMETHING SLIPS OUT BUT GOES BACK IN. LIFTING LESS. SUBJECTIVE CHILLS. PAIN AFTER HE USES THE BR. PAIN LASTS 5-10 MINS(CUTTING). IN PROCESS OF PASSING STOOL HAS PAIN AND DISCOMFORT BUT MAINLY AFTER STOOLS PASSES.  BMs: EVERY 3-4 DAYS AND MAY BE TWICE IN A DAY. DEPENDS ON DIET. IF NO STOOL FEELS PRESSURE IN HIS RECTUM AND USU. A LARGE STOOL. NO SOILING. IF SITS FOR A LONG TIME CAN FEELS SWELLING IN ANAL CANAL. PT DENIES FEVER, CHILLS, nausea, vomiting, melena, diarrhea, abd pain, problems swallowing, problems with sedation, OR heartburn or indigestion.   Past Medical History  Diagnosis Date  . Hypertension   . Depression   . Anal fissure   . Anxiety   . H/O hiatal hernia   . GERD (gastroesophageal reflux disease)     Past Surgical History  Procedure Laterality Date  . Appendectomy    . Circumcision    . Anterior cervical disectomy    . Colonoscopy  11/21/2011    Procedure: COLONOSCOPY;  Surgeon: Danie Binder, MD;  Location: [facility];  Service: Endoscopy;  Laterality: N/A;  9:50  . Dg arthro thumb*l*      removal of cyst   No Known Allergies  Current Outpatient Prescriptions  Medication Sig Dispense Refill  . acetaminophen (TYLENOL) 500 MG tablet Take 1,000 mg by mouth every 6 (six) hours as needed. Pain      . ALPRAZolam (XANAX) 0.5 MG tablet Take 0.5 mg by mouth at bedtime as needed. For sleep      . aspirin 81 MG tablet Take 81 mg by mouth daily.      Marland Kitchen lisinopril (PRINIVIL,ZESTRIL) 10 MG tablet Take 10 mg by mouth daily.       . naproxen (NAPROSYN) 500 MG tablet Take 500 mg by mouth daily as needed. For inflammation      .          Review of Systems     Objective:   Physical Exam  Vitals reviewed. Constitutional: He is oriented to person,  place, and time. He appears well-nourished. No distress.  HENT:  Head: Normocephalic and atraumatic.  Mouth/Throat: Oropharynx is clear and moist. No oropharyngeal exudate.  Eyes: Pupils are equal, round, and reactive to light. No scleral icterus.  Neck: Normal range of motion. Neck supple.  Cardiovascular: Normal rate, regular rhythm and normal heart sounds.   Pulmonary/Chest: Effort normal and breath sounds normal. No respiratory distress.  Abdominal: Soft. Bowel sounds are normal. He exhibits no distension. There is no tenderness.  Musculoskeletal: He exhibits no edema.  Lymphadenopathy:    He has no cervical adenopathy.  Neurological: He is alert and oriented to person, place, and time.  NO FOCAL DEFICITS   Psychiatric: He has a normal mood and affect.          Assessment & Plan:

## 2013-04-07 NOTE — Progress Notes (Signed)
cc'd to pcp 

## 2013-04-07 NOTE — Assessment & Plan Note (Signed)
DUE TO HEMORRHOIDS & EXACERBATED BY CONSTIPATION.  ADD LINZESS DISCUSSED BENEFITS V. RISKS OF [facility] V. FSIG/IH BANDING INCLUDING PELVIC VEIN SEPSIS. PT ELECTED FSIG IH BANDING. SCHEDULE FEB 6. PHENERGAN 12.5 MG IV IN PREOP. OPV IN 3 WEEKS.

## 2013-04-07 NOTE — Patient Instructions (Signed)
FLEX SIG TO FIX HEMORRHOIDS FEB 6.  DRINK WATER TO KEEP YOUR URINE LIGHT YELLOW.  FOLLOW A HIGH FIBER DIET. SEE INFO BELOW.  TO TREAT CONSTIPATION, ADD LINZESS 30 MINS PRIOR TO BREAKFAST. IT MAY CAUSE EXPLOSIVE DIARRHEA.  FOLLOW UP IN 3 WEEKS TO RE-ASESSS HEMORRHOIDS.    HEMORRHOIDAL BANDING COMPLICATIONS:  COMMON: 1. MINOR PAIN  UNCOMMON: 1. ABSCESS 2. BAND FALLS OFF 3. PROLAPSE OF HEMORRHOIDS AND PAIN 4. ULCER BLEEDING  A. USUALLY SELF-LIMITED: MAY LAST 3-5 DAYS  B. MAY REQUIRE INTERVENTION: 1-2 WEEKS AFTER INTERACTIONS 5. NECROTIZING PELVIC SEPSIS(< %)  A. SYMPTOMS: FEVER, PAIN, DIFFICULTY URINATING  High-Fiber Diet A high-fiber diet changes your normal diet to include more whole grains, legumes, fruits, and vegetables. Changes in the diet involve replacing refined carbohydrates with unrefined foods. The calorie level of the diet is essentially unchanged. The Dietary Reference Intake (recommended amount) for adult males is 38 grams per day. For adult females, it is 25 grams per day. Pregnant and lactating women should consume 28 grams of fiber per day. Fiber is the intact part of a plant that is not broken down during digestion. Functional fiber is fiber that has been isolated from the plant to provide a beneficial effect in the body. PURPOSE  Increase stool bulk.   Ease and regulate bowel movements.   Lower cholesterol.  INDICATIONS THAT YOU NEED MORE FIBER  Constipation and hemorrhoids.   Uncomplicated diverticulosis (intestine condition) and irritable bowel syndrome.   Weight management.   As a protective measure against hardening of the arteries (atherosclerosis), diabetes, and cancer.   GUIDELINES FOR INCREASING FIBER IN THE DIET  Start adding fiber to the diet slowly. A gradual increase of about 5 more grams (2 slices of whole-wheat bread, 2 servings of most fruits or vegetables, or 1 bowl of high-fiber cereal) per day is best. Too rapid an increase in fiber  may result in constipation, flatulence, and bloating.   Drink enough water and fluids to keep your urine clear or pale yellow. Water, juice, or caffeine-free drinks are recommended. Not drinking enough fluid may cause constipation.   Eat a variety of high-fiber foods rather than one type of fiber.   Try to increase your intake of fiber through using high-fiber foods rather than fiber pills or supplements that contain small amounts of fiber.   The goal is to change the types of food eaten. Do not supplement your present diet with high-fiber foods, but replace foods in your present diet.  INCLUDE A VARIETY OF FIBER SOURCES  Replace refined and processed grains with whole grains, canned fruits with fresh fruits, and incorporate other fiber sources. White rice, white breads, and most bakery goods contain little or no fiber.   Brown whole-grain rice, buckwheat oats, and many fruits and vegetables are all good sources of fiber. These include: broccoli, Brussels sprouts, cabbage, cauliflower, beets, sweet potatoes, white potatoes (skin on), carrots, tomatoes, eggplant, squash, berries, fresh fruits, and dried fruits.   Cereals appear to be the richest source of fiber. Cereal fiber is found in whole grains and bran. Bran is the fiber-rich outer coat of cereal grain, which is largely removed in refining. In whole-grain cereals, the bran remains. In breakfast cereals, the largest amount of fiber is found in those with "bran" in their names. The fiber content is sometimes indicated on the label.   You may need to include additional fruits and vegetables each day.   In baking, for 1 cup white flour, you may  use the following substitutions:   1 cup whole-wheat flour minus 2 tablespoons.   1/2 cup white flour plus 1/2 cup whole-wheat flour.

## 2013-04-09 ENCOUNTER — Encounter (HOSPITAL_COMMUNITY): Payer: Self-pay | Admitting: Pharmacy Technician

## 2013-04-12 ENCOUNTER — Encounter (HOSPITAL_COMMUNITY): Payer: Self-pay | Admitting: *Deleted

## 2013-04-12 ENCOUNTER — Ambulatory Visit: Admit: 2013-04-12 | Payer: Self-pay | Admitting: Gastroenterology

## 2013-04-12 ENCOUNTER — Encounter (HOSPITAL_COMMUNITY)
Admission: RE | Disposition: A | Discharge status: Home / Self Care | Payer: Self-pay | Source: Ambulatory Visit | Attending: Gastroenterology

## 2013-04-12 ENCOUNTER — Ambulatory Visit (HOSPITAL_COMMUNITY)
Admission: RE | Admit: 2013-04-12 | Discharge: 2013-04-12 | Disposition: A | Discharge status: Home / Self Care | Payer: Managed Care, Other (non HMO) | Source: Ambulatory Visit | Attending: Gastroenterology | Admitting: Gastroenterology

## 2013-04-12 DIAGNOSIS — K625 Hemorrhage of anus and rectum: Secondary | ICD-10-CM

## 2013-04-12 DIAGNOSIS — K5900 Constipation, unspecified: Secondary | ICD-10-CM

## 2013-04-12 DIAGNOSIS — K648 Other hemorrhoids: Secondary | ICD-10-CM | POA: Insufficient documentation

## 2013-04-12 DIAGNOSIS — Z7982 Long term (current) use of aspirin: Secondary | ICD-10-CM | POA: Insufficient documentation

## 2013-04-12 DIAGNOSIS — K921 Melena: Secondary | ICD-10-CM

## 2013-04-12 DIAGNOSIS — Z79899 Other long term (current) drug therapy: Secondary | ICD-10-CM | POA: Insufficient documentation

## 2013-04-12 DIAGNOSIS — I10 Essential (primary) hypertension: Secondary | ICD-10-CM | POA: Insufficient documentation

## 2013-04-12 HISTORY — PX: FLEXIBLE SIGMOIDOSCOPY: SHX5431

## 2013-04-12 HISTORY — PX: HEMORRHOID BANDING: SHX5850

## 2013-04-12 SURGERY — SIGMOIDOSCOPY, FLEXIBLE
Anesthesia: Moderate Sedation

## 2013-04-12 MED ORDER — SODIUM CHLORIDE 0.9 % IV SOLN
INTRAVENOUS | Status: DC
  Administered 2013-04-12: 12:00:00 via INTRAVENOUS

## 2013-04-12 MED ORDER — MIDAZOLAM HCL 5 MG/5ML IJ SOLN
INTRAMUSCULAR | Status: DC | PRN
  Administered 2013-04-12 (×2): 2 mg via INTRAVENOUS

## 2013-04-12 MED ORDER — MEPERIDINE HCL 100 MG/ML IJ SOLN
INTRAMUSCULAR | Status: DC | PRN
  Administered 2013-04-12 (×2): 25 mg via INTRAVENOUS

## 2013-04-12 MED ORDER — MIDAZOLAM HCL 5 MG/5ML IJ SOLN
INTRAMUSCULAR | Status: AC
  Filled 2013-04-12: qty 10

## 2013-04-12 MED ORDER — SIMETHICONE 40 MG/0.6ML PO SUSP
ORAL | Status: DC | PRN
  Administered 2013-04-12: 12:00:00

## 2013-04-12 MED ORDER — ATROPINE SULFATE 1 MG/ML IJ SOLN
INTRAMUSCULAR | Status: AC
  Filled 2013-04-12: qty 1

## 2013-04-12 MED ORDER — ATROPINE SULFATE 1 MG/ML IJ SOLN
0.5000 mg | Freq: Once | INTRAMUSCULAR | Status: AC
  Administered 2013-04-12: 0.5 mg via INTRAVENOUS

## 2013-04-12 MED ORDER — LIDOCAINE HCL 2 % EX GEL
CUTANEOUS | Status: AC
  Filled 2013-04-12: qty 30

## 2013-04-12 MED ORDER — MEPERIDINE HCL 100 MG/ML IJ SOLN
INTRAMUSCULAR | Status: AC
  Filled 2013-04-12: qty 2

## 2013-04-12 MED ORDER — PROMETHAZINE HCL 25 MG/ML IJ SOLN
12.5000 mg | Freq: Once | INTRAMUSCULAR | Status: AC
  Administered 2013-04-12: 12.5 mg via INTRAVENOUS
  Filled 2013-04-12: qty 1

## 2013-04-12 MED ORDER — SODIUM CHLORIDE 0.9 % IJ SOLN
INTRAMUSCULAR | Status: AC
  Filled 2013-04-12: qty 10

## 2013-04-12 NOTE — Progress Notes (Signed)
Dr fields in to adjust hemorrhoid bands .  Pt felt nauseated and HR droped to 39 .  Atropine given.

## 2013-04-12 NOTE — Discharge Instructions (Signed)
HEMORRHOIDAL BANDING DISCHARGE INSTRUCTIONS ° °I PUT BANDS IN 3 AREAS ABOVE YOUR HEMORRHOIDS. CALL [phone] FOR FOR RECTAL BLEEDING, FEVER, PAIN, OR DIFFICULTY URINATING. YOU MAY SEE BLEEDING FOR 3 DAYS TO 2 WEEKS.  ° ° °HOME CARE INSTRUCTIONS ° °FOLLOWING YOUR PROCEDURE IT IS COMMON TO HAVE MINOR RECTAL DISCOMFORT OR PAIN. °1.  YOU may use ibuprofen 200 MG over-the-counter 2 every 6 hours as needed for mild rectal pain OR TYLENOL.   Take the ibuprofen with food and milk.  °2   Use sitz bath 3 times a day  AS NEEDED FOR RECTAL PAIN.  °3. Avoid straining to have bowel movements. °4. Keep anal area dry and clean.  °5. Do not use a donut shaped pillow or sit on the toilet for long periods. This increases blood pooling and pain.  °6. Move your bowels when your body has the urge; this will require less straining and will decrease pain and pressure.  °7. YOU MAY Add Colace 100 mg twice daily FOR 7 DAYS to soften the stool. HOLD FOR DIARRHEA. °8. FOLLOW A LOW RESIDUE DIET FOR 2 WEEKS. SEE INFO BELOW. °9. CONTINUE LINZESS. °10. FOLLOW UP IN 2-3WEEKS.  ° ° ° ° ° ° °SIGMOIDOSCOPY °Care After °Read the instructions outlined below and refer to this sheet in the next week. These discharge instructions provide you with general information on caring for yourself after you leave the hospital. While your treatment has been planned according to the most current medical practices available, unavoidable complications occasionally occur. If you have any problems or questions after discharge, call DR. FIELDS, [phone]. ° °ACTIVITY °· You may resume your regular activity, but move at a slower pace for the next 24 hours.  °· Take frequent rest periods for the next 24 hours.  °· Walking will help get rid of the air and reduce the bloated feeling in your belly (abdomen).  °· No driving for 24 hours (because of the medicine (anesthesia) used during the test).  °· You may shower.  °· Do not sign any important legal documents or operate  any machinery for 24 hours (because of the anesthesia used during the test).  °·  °NUTRITION °· Drink plenty of fluids.  °· You may resume your normal diet as instructed by your doctor.  °· Begin with a light meal and progress to your normal diet. Heavy or fried foods are harder to digest and may make you feel sick to your stomach (nauseated).  °· Avoid alcoholic beverages for 24 hours or as instructed.  °·  °MEDICATIONS °· You may resume your normal medications. °·  °WHAT YOU CAN EXPECT TODAY °· Some feelings of bloating in the abdomen.  °· Passage of more gas than usual.  °· Spotting of blood in your stool or on the toilet paper °· .  °IF YOU HAD POLYPS REMOVED DURING THE COLONOSCOPY: °· Eat a soft diet IF YOU HAVE NAUSEA, BLOATING, ABDOMINAL PAIN, OR VOMITING. °·   °FINDING OUT THE RESULTS OF YOUR TEST °Not all test results are available during your visit. DR. FIELDS WILL CALL YOU WITHIN 7 DAYS OF YOUR PROCEDUE WITH YOUR RESULTS. Do not assume everything is normal if you have not heard from DR. FIELDS IN ONE WEEK, CALL HER OFFICE AT [phone]. ° °SEEK IMMEDIATE MEDICAL ATTENTION AND CALL THE OFFICE: [phone] IF: °· You have more than a spotting of blood in your stool.  °· Your belly is swollen (abdominal distention).  °· You are nauseated or vomiting.  °·   You have a temperature over 101F.   You have abdominal pain or discomfort that is severe or gets worse throughout the day.    HEMORRHOIDAL BANDING COMPLICATIONS:  COMMON: 1. MINOR PAIN  UNCOMMON: 1. ABSCESS 2. BAND FALLS OFF 3. PROLAPSE OF HEMORRHOIDS AND PAIN 4. ULCER BLEEDING  A. USUALLY SELF-LIMITED: MAY LAST 3-5 DAYS  B. MAY REQUIRE INTERVENTION: 1-2 WEEKS AFTER INTERACTIONS 5. NECROTIZING PELVIC SEPSIS  A. SYMPTOMS: FEVER, PAIN, DIFFICULTY URINATING   Low Fiber and Residue Restricted Diet A low fiber diet restricts foods that contain carbohydrates that are not digested in the small intestine. A diet containing about 10 g of  fiber is considered low fiber. The diet needs to be individualized to suit patient tolerances and preferences and to avoid unnecessary restrictions. Generally, the foods emphasized in a low fiber diet have no skins or seeds. They may have been processed to remove bran, germ, or husks. Cooking may not necessarily eliminate the fiber. Cooking may, in fact, enable a greater quantity of fiber to be consumed in a lesser volume. Legumes and nuts are also restricted. The term low residue has also been used to describe low fiber diets, although the two are not the same. Residue refers to any substance that adds to bowel (colonic) contents, such as sloughed cells and intestinal bacteria, in addition to fiber. Residue-containing foods, prunes and prune juice, milk, and connective tissue from meats may also need to be eliminated. It is important to eliminate these foods during sudden (acute) attacks of inflammatory bowel disease, when there is a partial obstruction due to another reason, or when minimal fecal output is desired. When these problems are gone, a more normal diet may be used.  PURPOSE  Reduce stool weight and volume.   CHOOSING FOODS Check labels, especially on foods from the starch list. Often, dietary fiber content is listed with the Nutrition Facts panel.  Breads and Starches  Allowed: White, [place], and pita breads, plain rolls, buns, or sweet rolls, doughnuts, waffles, pancakes, bagels. Plain muffins, sweet breads, biscuits, matzoth. Flour. Soda, saltine, or graham crackers. Pretzels, rusks, melba toast, zwieback. Cooked cereals: cornmeal, farina, cream cereals. Dry cereals: refined corn, wheat, rice, and oat cereals (check label). Potatoes prepared any way without skins, refined macaroni, spaghetti, noodles, refined rice.   Avoid: Bread, rolls, or crackers made with whole-wheat, multigrains, rye, bran seeds, nuts, or coconut. Corn tortillas, table-shells. Corn chips, tortilla chips. Cereals  containing whole-grains, multigrains, bran, coconut, nuts, or raisins. Cooked or dry oatmeal. Coarse wheat cereals, granola. Cereals advertised as "high fiber." Potato skins. Whole-grain pasta, wild or brown rice. Popcorn.  Vegetables  Allowed:  Strained tomato and vegetable juices. Fresh: tender lettuce, cucumber, cabbage, spinach, bean sprouts. Cooked, canned: asparagus, bean sprouts, cut green or wax beans, cauliflower, pumpkin, beets, mushrooms, olives, spinach, yellow squash, tomato, tomato sauce (no seeds), zucchini (peeled), turnips. Canned sweet potatoes. Small amounts of celery, onion, radish, and green pepper may be used. Keep servings limited to  cup.   Avoid: Fresh, cooked, or canned: artichokes, baked beans, beet greens, broccoli, Brussels sprouts, French-style green beans, corn, kale, legumes, peas, sweet potatoes. Cooked: green or red cabbage, spinach. Avoid large servings of any vegetables.  Fruit  Allowed:  All fruit juices except prune juice. Cooked or canned: apricots applesauce, cantaloupe, cherries, grapefruit, grapes, kiwi, mandarin oranges, peaches, pears, fruit cocktail, pineapple, plums, watermelon. Fresh: banana, grapes, cantaloupe, avocado, cherries, pineapple, grapefruit, kiwi, nectarines, peaches, oranges, blueberries, plums. Keep servings limited to  cup or  1 piece.  °· Avoid: Fresh: apple with or without skin, apricots, mango, pears, raspberries, strawberries. Prune juice, stewed or dried prunes. Dried fruits, raisins, dates. Avoid large servings of all fresh fruits.  °Meat and Meat Substitutes °· Allowed:  Ground or well-cooked tender beef, ham, veal, lamb, pork, or poultry. Eggs, plain cheese. Fish, oysters, shrimp, lobster, other seafood. Liver, organ meats.  °· Avoid: Tough, fibrous meats with gristle. Peanut butter, smooth or chunky. Cheese with seeds, nuts, or other foods not allowed. Nuts, seeds, legumes, dried peas, beans, lentils.  °Milk °· Allowed:  All milk  products except those not allowed. Milk and milk product consumption should be minimal when low residue is desired.  °· Avoid: Yogurt that contains nuts or seeds.  °Soups and Combination Foods °· Allowed:  Bouillon, broth, or cream soups made from allowed foods. Any strained soup. Casseroles or mixed dishes made with allowed foods.  °· Avoid: Soups made from vegetables that are not allowed or that contain other foods not allowed.  °Desserts and Sweets °· Allowed:  Plain cakes and cookies, pie made with allowed fruit, pudding, custard, cream pie. Gelatin, fruit, ice, sherbet, frozen ice pops. Ice cream, ice milk without nuts. Plain hard candy, honey, jelly, molasses, syrup, sugar, chocolate syrup, gumdrops, marshmallows.  °· Avoid: Desserts, cookies, or candies that contain nuts, peanut butter, or dried fruits. Jams, preserves with seeds, marmalade.  °Fats and Oils °· Allowed:  Margarine, butter, cream, mayonnaise, salad oils, plain salad dressings made from allowed foods. Plain gravy, crisp bacon without rind.  °· Avoid: Seeds, nuts, olives. Avocados.  °Beverages °· Allowed:  All, except those listed to avoid.  °· Avoid: Fruit juices with high pulp, prune juice.  °Condiments °· Allowed:  Ketchup, mustard, horseradish, vinegar, cream sauce, cheese sauce, cocoa powder. Spices in moderation: allspice, basil, bay leaves, celery powder or leaves, cinnamon, cumin powder, curry powder, ginger, mace, marjoram, onion or garlic powder, oregano, paprika, parsley flakes, ground pepper, rosemary, sage, savory, tarragon, thyme, turmeric.  °· Avoid: Coconut, pickles.  °SAMPLE MEAL PLAN °The following menu is provided as a sample. Your daily menu plans will vary. Be sure to include a minimum of the following each day in order to provide essential nutrients for the adult: °· Starch/Bread/Cereal Group, 6 servings.  °· Fruit/Vegetable Group, 5 servings.  °· Meat/Meat Substitute Group, 2 servings.  °· Milk/Milk Substitute Group, 2  servings.  °A serving is equal to ½ cup for fruits, vegetables, and cooked cereals or 1 piece for foods such as a piece of bread, 1 orange, or 1 apple. For dry cereals and crackers, use serving sizes listed on the label. Combination foods may count as full or partial servings from various food groups. Fats, desserts, and sweets may be added to the meal plan after the requirements for essential nutrients are met. °SAMPLE MENU °Breakfast °· ½ cup orange juice.  °· 1 boiled egg.  °· 1 slice white toast.  °· Margarine.  °· ¾ cup cornflakes.  °· 1 cup milk.  °· Beverage.  °Lunch °· ½ cup chicken noodle soup.  °· 2 to 3 oz sliced roast beef.  °· 2 slices seedless rye bread.  °· Mayonnaise.  °· ½ cup tomato juice.  °· 1 small banana.  °· Beverage.  °Dinner °· 3 oz baked chicken.  °· ½ cup scalloped potatoes.  °· ½ cup cooked beets.  °· White dinner roll.  °· Margarine.  °· ½ cup canned peaches.  °· Beverage.  °

## 2013-04-12 NOTE — Progress Notes (Signed)
Pt States pain has dropped from 10 to 7.  Dr Oneida Alar notified.

## 2013-04-12 NOTE — H&P (View-Only) (Signed)
   Subjective:    Patient ID: [name], male    DOB: 09/30/1965, 47 y.o.   MRN: [id number]  MCGOUGH,WILLIAM M, MD  HPI Has seen [facility] off and on. EASES UP AND THEN COMES BACK. TAKING STOOL SOFTENERS(EQUATE). MAY FEEL SOMETHING SLIPS OUT BUT GOES BACK IN. LIFTING LESS. SUBJECTIVE CHILLS. PAIN AFTER HE USES THE BR. PAIN LASTS 5-10 MINS(CUTTING). IN PROCESS OF PASSING STOOL HAS PAIN AND DISCOMFORT BUT MAINLY AFTER STOOLS PASSES.  BMs: EVERY 3-4 DAYS AND MAY BE TWICE IN A DAY. DEPENDS ON DIET. IF NO STOOL FEELS PRESSURE IN HIS RECTUM AND USU. A LARGE STOOL. NO SOILING. IF SITS FOR A LONG TIME CAN FEELS SWELLING IN ANAL CANAL. PT DENIES FEVER, CHILLS, nausea, vomiting, melena, diarrhea, abd pain, problems swallowing, problems with sedation, OR heartburn or indigestion.   Past Medical History  Diagnosis Date  . Hypertension   . Depression   . Anal fissure   . Anxiety   . H/O hiatal hernia   . GERD (gastroesophageal reflux disease)     Past Surgical History  Procedure Laterality Date  . Appendectomy    . Circumcision    . Anterior cervical disectomy    . Colonoscopy  11/21/2011    Procedure: COLONOSCOPY;  Surgeon: Niya Behler L Marilin Kofman, MD;  Location: [facility];  Service: Endoscopy;  Laterality: N/A;  9:50  . Dg arthro thumb*l*      removal of cyst   No Known Allergies  Current Outpatient Prescriptions  Medication Sig Dispense Refill  . acetaminophen (TYLENOL) 500 MG tablet Take 1,000 mg by mouth every 6 (six) hours as needed. Pain      . ALPRAZolam (XANAX) 0.5 MG tablet Take 0.5 mg by mouth at bedtime as needed. For sleep      . aspirin 81 MG tablet Take 81 mg by mouth daily.      . lisinopril (PRINIVIL,ZESTRIL) 10 MG tablet Take 10 mg by mouth daily.       . naproxen (NAPROSYN) 500 MG tablet Take 500 mg by mouth daily as needed. For inflammation      .          Review of Systems     Objective:   Physical Exam  Vitals reviewed. Constitutional: He is oriented to person,  place, and time. He appears well-nourished. No distress.  HENT:  Head: Normocephalic and atraumatic.  Mouth/Throat: Oropharynx is clear and moist. No oropharyngeal exudate.  Eyes: Pupils are equal, round, and reactive to light. No scleral icterus.  Neck: Normal range of motion. Neck supple.  Cardiovascular: Normal rate, regular rhythm and normal heart sounds.   Pulmonary/Chest: Effort normal and breath sounds normal. No respiratory distress.  Abdominal: Soft. Bowel sounds are normal. He exhibits no distension. There is no tenderness.  Musculoskeletal: He exhibits no edema.  Lymphadenopathy:    He has no cervical adenopathy.  Neurological: He is alert and oriented to person, place, and time.  NO FOCAL DEFICITS   Psychiatric: He has a normal mood and affect.          Assessment & Plan:   

## 2013-04-12 NOTE — Interval H&P Note (Signed)
History and Physical Interval Note:  04/12/2013 12:00 PM  [name]  has presented today for surgery, with the diagnosis of RECTAL BLEEDING  The various methods of treatment have been discussed with the patient and family. After consideration of risks, benefits and other options for treatment, the patient has consented to  Procedure(s) with comments: FLEXIBLE SIGMOIDOSCOPY (N/A) - 12:00 HEMORRHOID BANDING (N/A) as a surgical intervention .  The patient's history has been reviewed, patient examined, no change in status, stable for surgery.  I have reviewed the patient's chart and labs.  Questions were answered to the patient's satisfaction.     [employer]

## 2013-04-12 NOTE — Progress Notes (Signed)
Dr Oneida Alar in to apply 2% xylocaine jelly per rectum.    Pt pain better 4.

## 2013-04-12 NOTE — Op Note (Signed)
[place], [address]   FLEX SIGMOIDOSCOPY PROCEDURE REPORT  PATIENT: [name], [name]  MR#: [id number] BIRTHDATE: Apr 12, 1965 , 47  yrs. old GENDER: Male ENDOSCOPIST: Barney Drain, MD REFERRED FG:[id number] Orson Ape, M.D. PROCEDURE DATE:  04/12/2013 PROCEDURE:   Sigmoidoscopy, diagnostic and Hemorrhoidectomy via banding(3) INDICATIONS:rectal bleeding. MEDICATIONS: Promethazine (Phenergan) 12.5mg  IV, Demerol 50 mg IV, and Versed 4 mg IV  DESCRIPTION OF PROCEDURE:    Physical exam was performed.  Informed consent was obtained from the patient after explaining the benefits, risks, and alternatives to procedure.  The patient was connected to monitor and placed in left lateral position. Continuous oxygen was provided by nasal cannula and IV medicine administered through an indwelling cannula.  After administration of sedation and rectal exam, the patients rectum was intubated and the [id number] ([id number])  colonoscope was advanced under direct visualization to the [place] The scope was removed slowly by carefully examining the color, texture, anatomy, and integrity mucosa on the way out.  The patient was recovered in endoscopy and discharged home in satisfactory condition.     COLON FINDINGS: The colonic mucosa appeared normal in the sigmoid colon and Moderate sized internal hemorrhoids were found.  3 BANDS APPLIED. TRAUMATIC INSERTION OF CAP CAUSED SML AMOUNT OF RECTAL BLEEDING.  PREP QUALITY: adequate      COMPLICATIONS: PT HAD RECTALPAIN AFTER BANDS. ONE BAND MANIPULATED. ATROPINE 1 MG IV GIVEN DUE TO HR 39. VISCOUS LIDOCAINE APPLIED FOR RECTAL PAIN. PT D/C HOME IN SATISFACTORY CONDITION.  ENDOSCOPIC IMPRESSION: 1.   The colonic mucosa appeared normal in the sigmoid colon 2.   Moderate sized internal hemorrhoids   RECOMMENDATIONS: 1.  MAY use ibuprofen 200 MG over-the-counter 2 every 6 hours as needed for mild rectal pain OR  TYLENOL.   Take the ibuprofen with food and milk. 2.   Use sitz bath 3 times a day OR LIDOCAINE JELLY AS NEEDED FOR RECTAL PAIN. 3.  Avoid straining to have bowel movements. 4.  Keep anal area dry and clean. 5.  Do not use a donut shaped pillow or sit on the toilet for long periods. 6.  Move your bowels when your body has the urge 7.  MAY Add Colace 100 mg twice daily FOR 7 DAYS to soften the stool.  HOLD FOR DIARRHEA. 8.  FOLLOW A LOW RESIDUE DIET FOR 2 WEEKS. 9.  CONTINUE LINZESS. 10.  FOLLOW UP IN 2-3 WEEKS.       _______________________________ Lorrin MaisBarney Drain, MD 04/12/2013 1:52 PM     PATIENT NAME:  [name], [name] MR#: [id number]

## 2013-04-15 ENCOUNTER — Encounter (HOSPITAL_COMMUNITY): Payer: Self-pay | Admitting: Gastroenterology

## 2013-05-05 ENCOUNTER — Ambulatory Visit (INDEPENDENT_AMBULATORY_CARE_PROVIDER_SITE_OTHER): Payer: Managed Care, Other (non HMO) | Admitting: Gastroenterology

## 2013-05-05 ENCOUNTER — Encounter: Payer: Self-pay | Admitting: Gastroenterology

## 2013-05-05 VITALS — BP 122/68 | HR 68 | Temp 98.2°F | Wt 284.6 lb

## 2013-05-05 DIAGNOSIS — K921 Melena: Secondary | ICD-10-CM

## 2013-05-05 DIAGNOSIS — K5900 Constipation, unspecified: Secondary | ICD-10-CM

## 2013-05-05 DIAGNOSIS — K6289 Other specified diseases of anus and rectum: Secondary | ICD-10-CM

## 2013-05-05 MED ORDER — NITROGLYCERIN 0.4 % RE OINT: TOPICAL_OINTMENT | RECTAL | Status: DC

## 2013-05-05 NOTE — Patient Instructions (Signed)
START RECTIV FOR RECTAL PAIN/BLEEDING TO RECTUM FOUR TIMES A DAY FOR 3 MOS. IT CAN CAUSE HEADACHES OR DIZZINESS  TAKE 1/4 CUP ALMONDS, PEANUTS, OR CASHEWS DAILY TO HELP REGULATED BOWEL MOVEMENTS.  DRINK WATER TO KEEP YOUR URINE LIGHT YELLOW.  FOLLOW A HIGH FIBER DIET.   FOLLOW UP IN 4 MOS.    High-Fiber Diet A high-fiber diet changes your normal diet to include more whole grains, legumes, fruits, and vegetables. Changes in the diet involve replacing refined carbohydrates with unrefined foods. The calorie level of the diet is essentially unchanged. The Dietary Reference Intake (recommended amount) for adult males is 38 grams per day. For adult females, it is 25 grams per day. Pregnant and lactating women should consume 28 grams of fiber per day. Fiber is the intact part of a plant that is not broken down during digestion. Functional fiber is fiber that has been isolated from the plant to provide a beneficial effect in the body. PURPOSE  Increase stool bulk.   Ease and regulate bowel movements.   Lower cholesterol.  INDICATIONS THAT YOU NEED MORE FIBER  Constipation and hemorrhoids.   Uncomplicated diverticulosis (intestine condition) and irritable bowel syndrome.   Weight management.   As a protective measure against hardening of the arteries (atherosclerosis), diabetes, and cancer.   GUIDELINES FOR INCREASING FIBER IN THE DIET  Start adding fiber to the diet slowly. A gradual increase of about 5 more grams (2 slices of whole-wheat bread, 2 servings of most fruits or vegetables, or 1 bowl of high-fiber cereal) per day is best. Too rapid an increase in fiber may result in constipation, flatulence, and bloating.   Drink enough water and fluids to keep your urine clear or pale yellow. Water, juice, or caffeine-free drinks are recommended. Not drinking enough fluid may cause constipation.   Eat a variety of high-fiber foods rather than one type of fiber.   Try to increase your intake  of fiber through using high-fiber foods rather than fiber pills or supplements that contain small amounts of fiber.   The goal is to change the types of food eaten. Do not supplement your present diet with high-fiber foods, but replace foods in your present diet.  INCLUDE A VARIETY OF FIBER SOURCES  Replace refined and processed grains with whole grains, canned fruits with fresh fruits, and incorporate other fiber sources. White rice, white breads, and most bakery goods contain little or no fiber.   Brown whole-grain rice, buckwheat oats, and many fruits and vegetables are all good sources of fiber. These include: broccoli, Brussels sprouts, cabbage, cauliflower, beets, sweet potatoes, white potatoes (skin on), carrots, tomatoes, eggplant, squash, berries, fresh fruits, and dried fruits.   Cereals appear to be the richest source of fiber. Cereal fiber is found in whole grains and bran. Bran is the fiber-rich outer coat of cereal grain, which is largely removed in refining. In whole-grain cereals, the bran remains. In breakfast cereals, the largest amount of fiber is found in those with "bran" in their names. The fiber content is sometimes indicated on the label.   You may need to include additional fruits and vegetables each day.   In baking, for 1 cup white flour, you may use the following substitutions:   1 cup whole-wheat flour minus 2 tablespoons.   1/2 cup white flour plus 1/2 cup whole-wheat flour.

## 2013-05-05 NOTE — Assessment & Plan Note (Signed)
SX IMPROVED BUT NOT RESOLVED.  ADD RECTIV DRINK WATER EAT FIBER OPV IN 4 MOS

## 2013-05-05 NOTE — Progress Notes (Signed)
   Subjective:    Patient ID: [name], male    DOB: Jul 25, 1965, 48 y.o.   MRN: [id number]  Leonides Grills, MD  HPI SAW JUST A TINGE OF BLOOD SINCE FLEX SIG/IH. EATING MORE FRUIT. NO SODAS FOR 2 WEEKS. BMs: Q3-4 DAYS. GETS THERE AND STOPS.  CASHEWS/PEANUTS MAY HELP HIM GO TO THE BR. TRYING TO LOSE WEIGHT. NO PROBLEMS AFTERWARDS. PT DENIES FEVER, CHILLS,  nausea, vomiting, melena, constipation, abd pain, problems swallowing, problems with sedation, OR heartburn or indigestion. TOOK A LITTLE LONGER TO WAKE UP.   Past Medical History  Diagnosis Date  . Hypertension   . Depression   . Anal fissure   . Anxiety   . H/O hiatal hernia   . GERD (gastroesophageal reflux disease)    Past Surgical History  Procedure Laterality Date  . Appendectomy    . Circumcision    . Anterior cervical disectomy    . Colonoscopy  11/21/2011    Procedure: COLONOSCOPY;  Surgeon: Danie Binder, MD;  Location: [facility];  Service: Endoscopy;  Laterality: N/A;  9:50  . Dg arthro thumb*l*      removal of cyst  . Flexible sigmoidoscopy N/A 04/12/2013    Procedure: FLEXIBLE SIGMOIDOSCOPY;  Surgeon: Danie Binder, MD;  Location: [facility];  Service: Endoscopy;  Laterality: N/A;  12:00  . Hemorrhoid banding N/A 04/12/2013    Procedure: HEMORRHOID BANDING;  Surgeon: Danie Binder, MD;  Location: [facility];  Service: Endoscopy;  Laterality: N/A;   No Known Allergies  Current Outpatient Prescriptions  Medication Sig Dispense Refill  . acetaminophen (TYLENOL) 500 MG tablet Take 1,000 mg by mouth every 6 (six) hours as needed. Pain      . ALPRAZolam (XANAX) 0.5 MG tablet Take 0.5 mg by mouth at bedtime as needed. For sleep      . lisinopril (PRINIVIL,ZESTRIL) 10 MG tablet Take 10 mg by mouth daily.       . naproxen sodium (ANAPROX) 550 MG tablet Take 550 mg by mouth daily.          Review of Systems     Objective:   Physical Exam  Vitals reviewed. Constitutional: He is oriented to person,  place, and time. He appears well-nourished. No distress.  HENT:  Head: Normocephalic and atraumatic.  Mouth/Throat: Oropharynx is clear and moist. No oropharyngeal exudate.  Eyes: Pupils are equal, round, and reactive to light. No scleral icterus.  Neck: Normal range of motion. Neck supple.  Cardiovascular: Normal rate, regular rhythm and normal heart sounds.   Pulmonary/Chest: Effort normal and breath sounds normal. No respiratory distress.  Lymphadenopathy:    He has no cervical adenopathy.  Neurological: He is alert and oriented to person, place, and time.  NO FOCAL DEFICITS   Psychiatric: He has a normal mood and affect.          Assessment & Plan:

## 2013-05-05 NOTE — Assessment & Plan Note (Signed)
UNABLE TO TOLERATE LINZESS.  EAT 1/4 CUP NUTS DAILY TO ACHIEVE A BM Q2-3 DAYS. DRINK WATER EAT FIBER OPV IN 4 MOS

## 2013-05-05 NOTE — Assessment & Plan Note (Signed)
IN PT S/P HEMORRHOID BANDING WITH PMHx; ANAL FISSURE  ADD RECTIV QID FOR 3 MOS TO HEAL FISSURE. EXPLAINED TO PT HOW TO ADMINISTER MED AND DISCUSSED SIDE EFFECTS. FOLLOW UP IN 4 MOS.

## 2013-05-06 NOTE — Progress Notes (Signed)
cc'd to pcp 

## 2013-05-07 ENCOUNTER — Encounter: Payer: Self-pay | Admitting: *Deleted

## 2013-05-07 ENCOUNTER — Ambulatory Visit (INDEPENDENT_AMBULATORY_CARE_PROVIDER_SITE_OTHER): Payer: Managed Care, Other (non HMO) | Admitting: Cardiology

## 2013-05-07 ENCOUNTER — Encounter: Payer: Self-pay | Admitting: Cardiology

## 2013-05-07 VITALS — BP 137/85 | HR 56 | Ht 75.0 in | Wt 281.0 lb

## 2013-05-07 DIAGNOSIS — R079 Chest pain, unspecified: Secondary | ICD-10-CM

## 2013-05-07 DIAGNOSIS — I498 Other specified cardiac arrhythmias: Secondary | ICD-10-CM

## 2013-05-07 DIAGNOSIS — R0602 Shortness of breath: Secondary | ICD-10-CM

## 2013-05-07 DIAGNOSIS — R001 Bradycardia, unspecified: Secondary | ICD-10-CM

## 2013-05-07 DIAGNOSIS — Z136 Encounter for screening for cardiovascular disorders: Secondary | ICD-10-CM

## 2013-05-07 NOTE — Progress Notes (Signed)
Clinical Summary Mr. Charter is a 48 y.o.male seen today as a new patient, he is referred for low heart rate, dyspnea, and chest pain  1. Low heart rates - told for some time he has low heart rates at times, often in the 50s - reports was in 30s when previously on a blood pressure medication, after it was stopped heart rate improved. He is unsure which medication it was.  - reports mild lightheadedness at times, usually after standing for a while. No history of syncope or presyncope. No palpitations.   2. Dyspnea/chest pain - dyspnea started 3-4 years ago, progressing over time. Can occur at rest or with exertion. Notes increased DOE, example walking up 1 flight of stairs causes significant DOE which is fairly new. - can get occasional chest discomfort as well, 2/10 dull pain in midchest. Usually comes on with exertion. Lasts up to 30 minutes. Not positional. Discomfort started a few years ago, notes increase in frequency but no change in severity.  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   Past Medical History  Diagnosis Date  . Hypertension   . Depression   . Anal fissure   . Anxiety   . H/O hiatal hernia   . GERD (gastroesophageal reflux disease)   . Obesity (BMI 30-39.9) AUG 2013 166 LBS    FEB 2015 284 LBS     No Known Allergies   Current Outpatient Prescriptions  Medication Sig Dispense Refill  . acetaminophen (TYLENOL) 500 MG tablet Take 1,000 mg by mouth every 6 (six) hours as needed. Pain      . ALPRAZolam (XANAX) 0.5 MG tablet Take 0.5 mg by mouth at bedtime as needed. For sleep      . lisinopril (PRINIVIL,ZESTRIL) 10 MG tablet Take 10 mg by mouth daily.       . naproxen sodium (ANAPROX) 550 MG tablet Take 550 mg by mouth daily.      . Nitroglycerin 0.4 % OINT PLACE A 1/2 PEA SIZE AMOUNT ON Q TIP OR FINGER COT AND INSERT IN ANAL CANAL QID FOR  3 MOS.  30 g  1   No current facility-administered medications for this visit.     Past Surgical History  Procedure Laterality Date  . Appendectomy    . Circumcision    . Anterior cervical disectomy    . Colonoscopy  11/21/2011    MOD IH    . Dg arthro thumb*l*      removal of cyst  . Flexible sigmoidoscopy N/A 04/12/2013    HEMRRHOID BANDING X3.   . Hemorrhoid banding N/A 04/12/2013    Procedure: HEMORRHOID BANDING;  Surgeon: Danie Binder, MD;  Location: [facility];  Service: Endoscopy;  Laterality: N/A;     No Known Allergies    Family History  Problem Relation Age of Onset  . Liver disease Brother     ? etiology     Social History Mr. Peets reports that he has never smoked. He does not have any smokeless tobacco history on file. Mr. Bernardini reports that he drinks alcohol.   Review of Systems CONSTITUTIONAL: No weight loss, fever, chills, weakness or fatigue.  HEENT: Eyes: No visual loss, blurred vision, double vision or yellow sclerae.No hearing loss, sneezing, congestion, runny nose or sore throat.  SKIN: No rash or itching.  CARDIOVASCULAR: per  HPI RESPIRATORY:per HPI  GASTROINTESTINAL: No anorexia, nausea, vomiting or diarrhea. No abdominal pain or blood.  GENITOURINARY: No burning on urination, no polyuria NEUROLOGICAL: No headache, dizziness, syncope, paralysis, ataxia, numbness or tingling in the extremities. No change in bowel or bladder control.  MUSCULOSKELETAL: No muscle, back pain, joint pain or stiffness.  LYMPHATICS: No enlarged nodes. No history of splenectomy.  PSYCHIATRIC: No history of depression or anxiety.  ENDOCRINOLOGIC: No reports of sweating, cold or heat intolerance. No polyuria or polydipsia.  Marland Kitchen   Physical Examination There were no vitals filed for this visit. There were no vitals filed for this visit.  Gen: resting comfortably, no acute distress HEENT: no scleral icterus, pupils equal round and reactive, no palptable cervical  adenopathy,  CV Resp: Clear to auscultation bilaterally GI: abdomen is soft, non-tender, non-distended, normal bowel sounds, no hepatosplenomegaly MSK: extremities are warm, no edema.  Skin: warm, no rash Neuro:  no focal deficits Psych: appropriate affect   Diagnostic Studies 05/07/13 EKG: NSR, rate 64, non-spec ST/T changes  09/2012 EKG NSR, rate 48, pr 179ms, no block    Assessment and Plan  1. Chest pain/Dyspnea - symptoms concerning for possible cardiac etiology - will obtain echo and stress echo to further evaluate  2. Low heart rates - EKG from 2014 shows sinus brady rate 48, no block. EKG today shows NSR rate 64, no block - denies any symptoms - will eval chronoctropic response to exercise with stress echo - check TSH    Follow up 1 month  Arnoldo Lenis, M.D., F.A.C.C.

## 2013-05-07 NOTE — Patient Instructions (Signed)
   Lab for TSH (thyroid)  Your physician has requested that you have an echocardiogram. Echocardiography is a painless test that uses sound waves to create images of your heart. It provides your doctor with information about the size and shape of your heart and how well your heart's chambers and valves are working. This procedure takes approximately one hour. There are no restrictions for this procedure. Your physician has requested that you have a stress echocardiogram. For further information please visit [email or web address]. Please follow instruction sheet as given. Office will contact with results via phone or letter.   Follow up in  1 month

## 2013-05-10 NOTE — Progress Notes (Signed)
Reminder in epic °

## 2013-05-12 ENCOUNTER — Other Ambulatory Visit: Payer: Managed Care, Other (non HMO)

## 2013-05-21 ENCOUNTER — Ambulatory Visit (HOSPITAL_COMMUNITY): Admission: RE | Admit: 2013-05-21 | Payer: Managed Care, Other (non HMO) | Source: Ambulatory Visit

## 2013-05-27 ENCOUNTER — Other Ambulatory Visit (INDEPENDENT_AMBULATORY_CARE_PROVIDER_SITE_OTHER): Payer: Managed Care, Other (non HMO)

## 2013-05-27 ENCOUNTER — Other Ambulatory Visit: Payer: Self-pay

## 2013-05-27 DIAGNOSIS — R0602 Shortness of breath: Secondary | ICD-10-CM

## 2013-05-27 DIAGNOSIS — R079 Chest pain, unspecified: Secondary | ICD-10-CM

## 2013-05-31 ENCOUNTER — Telehealth: Payer: Self-pay | Admitting: Cardiology

## 2013-05-31 ENCOUNTER — Encounter: Payer: Self-pay | Admitting: Cardiology

## 2013-05-31 NOTE — Telephone Encounter (Signed)
Pt informed of lab results and verbalized understanding.

## 2013-05-31 NOTE — Telephone Encounter (Signed)
LM for pt to returncall

## 2013-05-31 NOTE — Telephone Encounter (Signed)
Mailed letter to pt informing pt of results.

## 2013-05-31 NOTE — Telephone Encounter (Signed)
Message copied by Lewayne Bunting on Mon May 31, 2013 12:03 PM ------      Message from: [place] F      Created: Mon May 31, 2013  9:28 AM       Please let patient know that echo is normal            Carlyle Dolly MD ------

## 2013-06-01 ENCOUNTER — Other Ambulatory Visit (HOSPITAL_COMMUNITY): Payer: Managed Care, Other (non HMO)

## 2013-06-04 ENCOUNTER — Ambulatory Visit (HOSPITAL_COMMUNITY)
Admission: RE | Admit: 2013-06-04 | Discharge: 2013-06-04 | Disposition: A | Discharge status: Home / Self Care | Payer: Managed Care, Other (non HMO) | Source: Ambulatory Visit | Attending: Cardiology | Admitting: Cardiology

## 2013-06-04 ENCOUNTER — Encounter (HOSPITAL_COMMUNITY): Payer: Self-pay

## 2013-06-04 DIAGNOSIS — R0609 Other forms of dyspnea: Secondary | ICD-10-CM | POA: Insufficient documentation

## 2013-06-04 DIAGNOSIS — R0989 Other specified symptoms and signs involving the circulatory and respiratory systems: Secondary | ICD-10-CM | POA: Insufficient documentation

## 2013-06-04 DIAGNOSIS — Z6835 Body mass index (BMI) 35.0-35.9, adult: Secondary | ICD-10-CM | POA: Insufficient documentation

## 2013-06-04 DIAGNOSIS — R072 Precordial pain: Secondary | ICD-10-CM

## 2013-06-04 DIAGNOSIS — R079 Chest pain, unspecified: Secondary | ICD-10-CM | POA: Insufficient documentation

## 2013-06-04 DIAGNOSIS — I498 Other specified cardiac arrhythmias: Secondary | ICD-10-CM | POA: Insufficient documentation

## 2013-06-04 DIAGNOSIS — I10 Essential (primary) hypertension: Secondary | ICD-10-CM | POA: Insufficient documentation

## 2013-06-04 NOTE — Progress Notes (Signed)
*  PRELIMINARY RESULTS* Echocardiogram Echocardiogram Stress Test has been performed.  [place], [place] 06/04/2013, 11:49 AM

## 2013-06-04 NOTE — Progress Notes (Signed)
Stress Lab Nurses Notes - Sadler Teschner 06/04/2013 Reason for doing test: Chest Pain and Dyspnea Type of test: Stress Echo Nurse performing test: Gerrit Halls, RN Nuclear Medicine Tech: Not Applicable Echo Tech: Jamison Neighbor MD performing test: P. Ross/K.Purcell Nails NP Family MD: McGough Test explained and consent signed: yes IV started: No IV started Symptoms: SOB Treatment/Intervention: None Reason test stopped: fatigue After recovery IV was: NA Patient to return to [facility]. Med at : NA Patient discharged: Home Patient's Condition upon discharge was: stable Comments: During test peak BP 185/66 & HR 169.  Recovery BP 125/52 & HR 84.  Symptoms resolved in recovery Geanie Cooley T

## 2013-06-14 ENCOUNTER — Encounter: Payer: Self-pay | Admitting: *Deleted

## 2013-06-16 ENCOUNTER — Ambulatory Visit: Payer: Managed Care, Other (non HMO) | Admitting: Cardiology

## 2013-07-05 ENCOUNTER — Ambulatory Visit (INDEPENDENT_AMBULATORY_CARE_PROVIDER_SITE_OTHER): Payer: Managed Care, Other (non HMO) | Admitting: Cardiology

## 2013-07-05 ENCOUNTER — Encounter: Payer: Self-pay | Admitting: Cardiology

## 2013-07-05 VITALS — BP 111/70 | HR 66 | Ht 75.0 in | Wt 288.0 lb

## 2013-07-05 DIAGNOSIS — R079 Chest pain, unspecified: Secondary | ICD-10-CM

## 2013-07-05 DIAGNOSIS — R0602 Shortness of breath: Secondary | ICD-10-CM

## 2013-07-05 MED ORDER — RANITIDINE HCL 150 MG PO TABS: 150.0000 mg | ORAL_TABLET | Freq: Two times a day (BID) | ORAL | Status: DC

## 2013-07-05 NOTE — Patient Instructions (Signed)
Your physician has recommended that you have a pulmonary function test. Pulmonary Function Tests are a group of tests that measure how well air moves in and out of your lungs. Office will contact with results via phone or letter.   Begin Zantac 150mg  twice a day  - OTC Continue all other medications.   Your physician wants you to follow up in: 6 months.  You will receive a reminder letter in the mail one-two months in advance.  If you don't receive a letter, please call our office to schedule the follow up appointment

## 2013-07-05 NOTE — Progress Notes (Signed)
Clinical Summary Mr. Bergen is a 48 y.o.male seen today for follow up of the following medical problems.  1. Low heart rates  - told for some time he has low heart rates at times, often in the 50s  - reports was in 30s when previously on a blood pressure medication, after it was stopped heart rate improved. He is unsure which medication it was.  - reports mild lightheadedness at times, usually after standing for a while. No history of syncope or presyncope. No palpitations.   - since last visit he had normal chrnotropic response on recent exercise stress test. A TSH is currently pending.   2. Dyspnea/chest pain  - dyspnea started 3-4 years ago, progressing over time. Can occur at rest or with exertion. Notes increased DOE, example walking up 1 flight of stairs causes significant DOE which is fairly new.  - can get occasional chest discomfort as well, 2/10 dull pain in midchest. Usually comes on with exertion. Lasts up to 30 minutes. Not positional. Discomfort started a few years ago, notes increase in frequency but no change in severity.  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  - still having some symptoms since discharge.  - since last visit completed echo that showed normal LV systolic function, stress echo was negative for ischemia    Past Medical History  Diagnosis Date  . Hypertension   . Depression   . Anal fissure   . Anxiety   . H/O hiatal hernia   . GERD (gastroesophageal reflux disease)   . Obesity (BMI 30-39.9) AUG 2013 166 LBS    FEB 2015 284 LBS     No Known Allergies   Current Outpatient Prescriptions  Medication Sig Dispense Refill  . acetaminophen (TYLENOL) 500 MG tablet Take 1,000 mg by mouth every 6 (six) hours as needed. Pain      . ALPRAZolam (XANAX) 0.5 MG tablet Take 0.5 mg by mouth at bedtime as needed. For sleep      .  aspirin EC 81 MG tablet Take 81 mg by mouth daily.      Marland Kitchen LINZESS 145 MCG CAPS capsule Take 1 capsule by mouth daily as needed.      Marland Kitchen lisinopril (PRINIVIL,ZESTRIL) 10 MG tablet Take 10 mg by mouth daily.       . naproxen sodium (ANAPROX) 550 MG tablet Take 550 mg by mouth daily.      . Nitroglycerin 0.4 % OINT PLACE A 1/2 PEA SIZE AMOUNT ON Q TIP OR FINGER COT AND INSERT IN ANAL CANAL QID FOR 3 MOS.  30 g  1   No current facility-administered medications for this visit.     Past Surgical History  Procedure Laterality Date  . Appendectomy    . Circumcision    . Anterior cervical disectomy    . Colonoscopy  11/21/2011    MOD IH    . Dg arthro thumb*l*      removal of cyst  . Flexible sigmoidoscopy N/A 04/12/2013    HEMRRHOID BANDING X3.   . Hemorrhoid banding N/A 04/12/2013    Procedure: HEMORRHOID BANDING;  Surgeon: Danie Binder, MD;  Location: [facility];  Service: Endoscopy;  Laterality: N/A;     No Known Allergies    Family History  Problem Relation Age of Onset  . Liver disease Brother     ?  etiology     Social History Mr. Toner reports that he has never smoked. He has never used smokeless tobacco. Mr. Dunnaway reports that he drinks alcohol.   Review of Systems CONSTITUTIONAL: No weight loss, fever, chills, weakness or fatigue.  HEENT: Eyes: No visual loss, blurred vision, double vision or yellow sclerae.No hearing loss, sneezing, congestion, runny nose or sore throat.  SKIN: No rash or itching.  CARDIOVASCULAR: per HPI RESPIRATORY: No cough or sputum.  GASTROINTESTINAL: No anorexia, nausea, vomiting or diarrhea. No abdominal pain or blood.  GENITOURINARY: No burning on urination, no polyuria NEUROLOGICAL: No headache, dizziness, syncope, paralysis, ataxia, numbness or tingling in the extremities. No change in bowel or bladder control.  MUSCULOSKELETAL: No muscle, back pain, joint pain or stiffness.  LYMPHATICS: No enlarged nodes. No history of splenectomy.    PSYCHIATRIC: No history of depression or anxiety.  ENDOCRINOLOGIC: No reports of sweating, cold or heat intolerance. No polyuria or polydipsia.  Marland Kitchen   Physical Examination p 66 bp 111/70 Wt 288 lbs BMI 36 Gen: resting comfortably, no acute distress HEENT: no scleral icterus, pupils equal round and reactive, no palptable cervical adenopathy,  CV: RRR, no m/r/g, no JVD, no carotid bruits Resp: Clear to auscultation bilaterally GI: abdomen is soft, non-tender, non-distended, normal bowel sounds, no hepatosplenomegaly MSK: extremities are warm, no edema.  Skin: warm, no rash Neuro:  no focal deficits Psych: appropriate affect   Diagnostic Studies 05/07/13 EKG: NSR, rate 64, non-spec ST/T changes   09/2012 EKG  NSR, rate 48, pr 128ms, no block  05/2013 Echo LVEF 60-65%, mild LVH, normal diastolic  11/5282 Stress echo Stress results: Maximal heart rate during stress was 157bpm (97% of maximal predicted heart rate). The maximal predicted heart rate was 173bpm.The target heart rate was achieved. The heart rate response to stress was normal. There was a normal resting blood pressure with an appropriate response to stress. The rate-pressure product for the peak heart rate and blood pressure was 27915mm Hg/min. The patient experienced no chest pain during stress. Functional capacity was normal.  ------------------------------------------------------------ Baseline:  - The estimated LV ejection fraction was 55%. - Hypokinesis of the distallateral and apical LV myocardium. Immediate post stress:  - LV global systolic function was vigorous. - Normal wall motion; no LV regional wall motion abnormalities.    Assessment and Plan  1. Chest pain/Dyspnea  - negative stress echo, no significant pathology by 2D echo - will start emperic H2 blocker for possible GI cause, he has taken NSAIDs regularly for some time and may have some form of gastitis/PUD. - long history of cotton [employer]  exposure, with SOB will check PFTs for possible lung disease.   2. Low heart rates  - noted previously, by last few visits rates have been normal. - normal chronotropic response with exercise by recent GXT - f/u TSH, otherwise no further workup indicated.        Arnoldo Lenis, M.D., F.A.C.C.

## 2013-07-19 ENCOUNTER — Inpatient Hospital Stay (HOSPITAL_COMMUNITY): Admission: RE | Admit: 2013-07-19 | Payer: Managed Care, Other (non HMO) | Source: Ambulatory Visit

## 2013-09-08 ENCOUNTER — Encounter: Payer: Self-pay | Admitting: Gastroenterology

## 2013-10-25 ENCOUNTER — Telehealth: Payer: Self-pay | Admitting: *Deleted

## 2013-10-25 NOTE — Telephone Encounter (Signed)
Follow up - patient no show for PFT in May.   Left message to return call.

## 2013-11-04 ENCOUNTER — Encounter: Payer: Self-pay | Admitting: *Deleted

## 2013-11-04 NOTE — Telephone Encounter (Signed)
No return call to present date, letter mailed today.

## 2013-11-25 ENCOUNTER — Encounter: Payer: Self-pay | Admitting: Gastroenterology

## 2013-11-25 ENCOUNTER — Ambulatory Visit (INDEPENDENT_AMBULATORY_CARE_PROVIDER_SITE_OTHER): Payer: Managed Care, Other (non HMO) | Admitting: Gastroenterology

## 2013-11-25 ENCOUNTER — Other Ambulatory Visit: Payer: Self-pay

## 2013-11-25 VITALS — BP 134/80 | HR 57 | Temp 97.9°F | Ht 75.0 in | Wt 282.8 lb

## 2013-11-25 DIAGNOSIS — K5901 Slow transit constipation: Secondary | ICD-10-CM

## 2013-11-25 DIAGNOSIS — K921 Melena: Secondary | ICD-10-CM

## 2013-11-25 DIAGNOSIS — K6289 Other specified diseases of anus and rectum: Secondary | ICD-10-CM

## 2013-11-25 DIAGNOSIS — K648 Other hemorrhoids: Secondary | ICD-10-CM

## 2013-11-25 MED ORDER — LUBIPROSTONE 24 MCG PO CAPS: ORAL_CAPSULE | ORAL | Status: DC

## 2013-11-25 NOTE — Patient Instructions (Addendum)
START AMITIZA AT BEDTIME FOR 8 DAYS THEN INCREASE AMITIZA TO TWICE DAILY. IT CAN CAUSE NAUSEA.  DRINK WATER TO KEEP YOUR URINE LIGHT YELLOW.  FOLLOW A HIGH FIBER/LOW FAT DIET.   SEE SURGERY FOR AN EVALUATION FOR HEMORRHOIDECTOMY AND VENTRAL HERNIA REPAIR.  AVOID CONSTIPATION.  FOLLOW UP IN 6 MOS. MERRY CHRISTMAS AND HAPPY NEW YEAR!   Constipation in Adults Constipation is having fewer than 2 bowel movements per week. Usually, the stools are hard. As we grow older, constipation is more common. If you try to fix constipation with laxatives, the problem may get worse. This is because laxatives taken over a long period of time make the colon muscles weaker. A low-fiber diet, not taking in enough fluids, and taking some medicines may make these problems worse.  HOME CARE INSTRUCTIONS  Constipation is usually best cared for without medicines. Increasing dietary fiber and eating more fruits and vegetables is the best way to manage constipation.   Slowly increase fiber intake to 25 to 38 grams per day. Whole grains, fruits, vegetables, and legumes are good sources of fiber. A dietitian can further help you incorporate high-fiber foods into your diet.   Drink enough water and fluids to keep your urine clear or pale yellow.   A fiber supplement may be added to your diet if you cannot get enough fiber from foods.   Increasing your activities also helps improve regularity.   Stronger measures, such as magnesium sulfate, should be avoided if possible. This may cause uncontrollable diarrhea. Using magnesium sulfate may not allow you time to make it to the bathroom.

## 2013-11-25 NOTE — Assessment & Plan Note (Signed)
MOST LIKELY DUE TO EXTERNAL HEMORHOIDS, LESS LIKELY CHRONIC ANAL FISSURE.  SEE SURGERY FOR AN EVALUATION FOR HEMORRHOIDECTOMY AND VENTRAL HERNIA REPAIR AVOID CONSTIPATION DRINK WATER EAT FIBER OPV IN 6 MOS

## 2013-11-25 NOTE — Assessment & Plan Note (Signed)
DUE TO FISSURE/HEMORHOIDS. PRESSURE MOST LIKELY DUE TO EXTERNAL HEMORRHOIDS.  SEE SURGERY FOR AN EVALUATION AVOID CONSTIPATION DRINK WATER EAT FIBER OPV IN 6 MOS

## 2013-11-25 NOTE — Progress Notes (Signed)
cc'ed to pcp °

## 2013-11-25 NOTE — Assessment & Plan Note (Signed)
SX NOT IDEALLY CONTROLLED. CAN'T TOLERATE LINZESS.  TRY AMITIZA BID SEE SURGERY FOR AN EVALUATION FOR HEMORRHOIDECTOMY AND VENTRAL HERNIA REPAIR AVOID CONSTIPATION DRINK WATER EAT FIBER OPV IN 6 MOS

## 2013-11-25 NOTE — Progress Notes (Signed)
Subjective:    Patient ID: [name], male    DOB: 06/13/1965, 48 y.o.   MRN: [id number]  Leonides Grills, MD  HPI Trying to follow recommendations. WORKING OUT SOME. MAY HAVE NL BM. HAS HAD TO HOLD STOOL IN THE PAST. MAY HAVE DISCOMFORT WHEN HE USES THE BATHROOM. FEELS PRESSURE AND IT SWELLS. STOPPED LINZESS. WORKING VERY WELL. GOT BETTER AFTER BANDING BUT SX DID NOT COMPLETELY RESOLVE. HEMORRHOID SX: SINCE FEB 2015 ABOUT THE SAME. EATING HIGH FIBER DIET AND DRINKING MORE WATER. NOT LIFTING HEAVY THINGS ANYMORE. RECTAL PAIN/PRESSURE: 2X/WEEK AFTER A HARD STOOL. CAN FEEL LIKE A KNOT. CREAM HELPS. RECTAL BLEEDING: 2-3X/WEEK(DRIPPING IN BOWL AND WHEN HE WIPES) RECTIV HELPED WITH PAIN & NEXT AM DISCOMFORT WENT AWAY.  BMs: 2X/WEEK. NO MIRALAX OR OTC LAXATIVES. JUN 2013: 266 LBS  PT DENIES FEVER, CHILLS, HEMATEMESIS, nausea, vomiting, melena, diarrhea, CHEST PAIN, SHORTNESS OF BREATH,  CHANGE IN BOWEL IN HABITS, abdominal pain, problems swallowing, OR heartburn or indigestion.  Past Medical History  Diagnosis Date  . Hypertension   . Depression   . Anal fissure   . Anxiety   . H/O hiatal hernia   . GERD (gastroesophageal reflux disease)   . Obesity (BMI 30-39.9) AUG 2013 266 LBS    FEB 2015 284 LBS   Past Surgical History  Procedure Laterality Date  . Appendectomy    . Circumcision    . Anterior cervical disectomy    . Colonoscopy  11/21/2011    MOD IH    . Dg arthro thumb*l*      removal of cyst  . Flexible sigmoidoscopy N/A 04/12/2013    HEMRRHOID BANDING X3.   . Hemorrhoid banding N/A 04/12/2013    Procedure: HEMORRHOID BANDING;  Surgeon: Danie Binder, MD;  Location: [facility];  Service: Endoscopy;  Laterality: N/A;   No Known Allergies  Current Outpatient Prescriptions  Medication Sig Dispense Refill  . acetaminophen (TYLENOL) 500 MG tablet Take 1,000 mg by mouth every 6 (six) hours as needed. Pain      . ALPRAZolam (XANAX) 0.5 MG tablet Take 0.5 mg by mouth at  bedtime as needed. For sleep      . aspirin EC 81 MG tablet Take 81 mg by mouth daily.      Marland Kitchen lisinopril (PRINIVIL,ZESTRIL) 10 MG tablet Take 10 mg by mouth daily.       . Nitroglycerin 0.4 % OINT PLACE A 1/2 PEA SIZE AMOUNT ON Q TIP OR FINGER COT AND INSERT IN ANAL CANAL QID FOR 3 MOS.    . naproxen sodium (ANAPROX) 550 MG tablet Take 550 mg by mouth daily.      . ranitidine (ZANTAC) 150 MG tablet Take 1 tablet (150 mg total) by mouth 2 (two) times daily.         Review of Systems     Objective:   Physical Exam  Vitals reviewed. Constitutional: He is oriented to person, place, and time. He appears well-developed and well-nourished. No distress.  HENT:  Head: Normocephalic and atraumatic.  Mouth/Throat: Oropharynx is clear and moist. No oropharyngeal exudate.  Eyes: Pupils are equal, round, and reactive to light. No scleral icterus.  Neck: Normal range of motion. Neck supple.  Cardiovascular: Normal rate, regular rhythm and normal heart sounds.   Pulmonary/Chest: Effort normal and breath sounds normal. No respiratory distress.  Abdominal: Soft. Bowel sounds are normal. He exhibits no distension. There is tenderness. There is no rebound and no guarding.  MIDLINE BULGE  THAT INCREASES WITH VALSALVA, REDUCIBLE , MILD TTP MIDLINE ABOVE  PERI-UMBILICAL REGION   Musculoskeletal: He exhibits no edema.  Lymphadenopathy:    He has no cervical adenopathy.  Neurological: He is alert and oriented to person, place, and time.  NO FOCAL DEFICITS   Psychiatric:  SLIGHTLY ANXIOUS MOOD, NL AFFECT           Assessment & Plan:

## 2014-02-10 ENCOUNTER — Telehealth: Payer: Self-pay | Admitting: Cardiology

## 2014-02-10 DIAGNOSIS — R0602 Shortness of breath: Secondary | ICD-10-CM

## 2014-02-10 NOTE — Telephone Encounter (Signed)
Old order from May is still there under "order review".  Can you use this one?

## 2014-02-10 NOTE — Telephone Encounter (Signed)
Patient was scheduled for PFT's in May of 2015. Did not show for appointment. Wants to have them done before the end of year. Will need new Order.  # [phone] cell

## 2014-02-14 ENCOUNTER — Other Ambulatory Visit: Payer: Self-pay | Admitting: *Deleted

## 2014-02-14 DIAGNOSIS — R0609 Other forms of dyspnea: Principal | ICD-10-CM

## 2014-02-14 DIAGNOSIS — R0600 Dyspnea, unspecified: Secondary | ICD-10-CM

## 2014-02-16 ENCOUNTER — Encounter (HOSPITAL_COMMUNITY): Payer: Managed Care, Other (non HMO)

## 2014-02-21 ENCOUNTER — Ambulatory Visit (HOSPITAL_COMMUNITY)
Admission: RE | Admit: 2014-02-21 | Discharge: 2014-02-21 | Disposition: A | Discharge status: Home / Self Care | Payer: Managed Care, Other (non HMO) | Source: Ambulatory Visit | Attending: Cardiology | Admitting: Cardiology

## 2014-02-21 DIAGNOSIS — R0609 Other forms of dyspnea: Secondary | ICD-10-CM | POA: Diagnosis present

## 2014-02-21 DIAGNOSIS — R0600 Dyspnea, unspecified: Secondary | ICD-10-CM

## 2014-02-21 LAB — PULMONARY FUNCTION TEST
DL/VA % pred: 106 %
DL/VA: 5.21 ml/min/mmHg/L
DLCO cor % pred: 90 %
DLCO cor: 35.21 ml/min/mmHg
DLCO unc % pred: 90 %
DLCO unc: 35.21 ml/min/mmHg
FEF 25-75 Post: 2.27 L/sec
FEF 25-75 Pre: 2.38 L/sec
FEF2575-%Change-Post: -4 %
FEF2575-%Pred-Post: 56 %
FEF2575-%Pred-Pre: 58 %
FEV1-%Change-Post: 1 %
FEV1-%Pred-Post: 75 %
FEV1-%Pred-Pre: 74 %
FEV1-Post: 3.49 L
FEV1-Pre: 3.46 L
FEV1FVC-%Change-Post: 0 %
FEV1FVC-%Pred-Pre: 87 %
FEV6-%Change-Post: 0 %
FEV6-%Pred-Post: 87 %
FEV6-%Pred-Pre: 87 %
FEV6-Post: 5.09 L
FEV6-Pre: 5.06 L
FEV6FVC-%Change-Post: 0 %
FEV6FVC-%Pred-Post: 102 %
FEV6FVC-%Pred-Pre: 102 %
FVC-%Change-Post: 0 %
FVC-%Pred-Post: 85 %
FVC-%Pred-Pre: 85 %
FVC-Post: 5.14 L
FVC-Pre: 5.09 L
Post FEV1/FVC ratio: 68 %
Post FEV6/FVC ratio: 99 %
Pre FEV1/FVC ratio: 68 %
Pre FEV6/FVC Ratio: 99 %
RV % pred: 132 %
RV: 3.01 L
TLC % pred: 97 %
TLC: 7.75 L

## 2014-02-21 MED ORDER — ALBUTEROL SULFATE (2.5 MG/3ML) 0.083% IN NEBU
2.5000 mg | INHALATION_SOLUTION | Freq: Once | RESPIRATORY_TRACT | Status: AC
  Administered 2014-02-21: 2.5 mg via RESPIRATORY_TRACT

## 2014-03-10 ENCOUNTER — Telehealth: Payer: Self-pay | Admitting: *Deleted

## 2014-03-10 MED ORDER — ALBUTEROL SULFATE HFA 108 (90 BASE) MCG/ACT IN AERS: 1.0000 | INHALATION_SPRAY | Freq: Four times a day (QID) | RESPIRATORY_TRACT | Status: DC | PRN

## 2014-03-10 NOTE — Telephone Encounter (Signed)
Patient informed. Copy sent to [facility].

## 2014-03-10 NOTE — Telephone Encounter (Signed)
-----   Message from Arnoldo Lenis, MD sent at 03/10/2014 10:14 AM EST ----- Please let patient know his pulmonary tests show mild asthma/COPD. Please forward results to his pcp and give a prescription for albuterol inhaler 1 puff q 6 hrs prn SOB  Zandra Abts MD

## 2014-03-18 ENCOUNTER — Ambulatory Visit (INDEPENDENT_AMBULATORY_CARE_PROVIDER_SITE_OTHER): Payer: Managed Care, Other (non HMO) | Admitting: Cardiology

## 2014-03-18 ENCOUNTER — Encounter: Payer: Self-pay | Admitting: Cardiology

## 2014-03-18 VITALS — BP 121/85 | HR 62 | Ht 74.0 in | Wt 285.4 lb

## 2014-03-18 DIAGNOSIS — R0600 Dyspnea, unspecified: Secondary | ICD-10-CM

## 2014-03-18 DIAGNOSIS — R001 Bradycardia, unspecified: Secondary | ICD-10-CM

## 2014-03-18 NOTE — Patient Instructions (Signed)
Your physician wants you to follow-up in: 1 year with Dr. Branch  You will receive a reminder letter in the mail two months in advance. If you don't receive a letter, please call our office to schedule the follow-up appointment.  Your physician recommends that you continue on your current medications as directed. Please refer to the Current Medication list given to you today.  Thank you for choosing  [facility]!!   

## 2014-03-18 NOTE — Progress Notes (Signed)
Clinical Summary [name] is a 49 y.o.male seen today for follow up of the following medical problems.   1. Low heart rates  - told for some time he has low heart rates at times, often in the 50s  - normal chronotropic response on recent exercise stress test.  - denies any lightheadedness or dizziness.  2. Dyspnea/chest pain .  - since last visit completed echo that showed normal LV systolic function, stress echo was negative for ischemia. PFTs showed mild obstruction consistent with mild asthma/COPD - still with some dyspnea, better with prn albuterol   Past Medical History  Diagnosis Date  . Hypertension   . Depression   . Anal fissure   . Anxiety   . H/O hiatal hernia   . GERD (gastroesophageal reflux disease)   . Obesity (BMI 30-39.9) AUG 2013 266 LBS    FEB 2015 284 LBS     No Known Allergies   Current Outpatient Prescriptions  Medication Sig Dispense Refill  . acetaminophen (TYLENOL) 500 MG tablet Take 1,000 mg by mouth every 6 (six) hours as needed. Pain    . albuterol (PROAIR HFA) 108 (90 BASE) MCG/ACT inhaler Inhale 1 puff into the lungs every 6 (six) hours as needed for wheezing or shortness of breath. 3.7 g 0  . ALPRAZolam (XANAX) 0.5 MG tablet Take 0.5 mg by mouth at bedtime as needed. For sleep    . aspirin EC 81 MG tablet Take 81 mg by mouth daily.    Marland Kitchen lisinopril (PRINIVIL,ZESTRIL) 10 MG tablet Take 10 mg by mouth daily.     Marland Kitchen lubiprostone (AMITIZA) 24 MCG capsule 1 PO QHS FOR 7 DAYS THEN 1 PO BID. TAKE WITH FOOD. 62 capsule 11  . naproxen sodium (ANAPROX) 550 MG tablet Take 550 mg by mouth daily.    . Nitroglycerin 0.4 % OINT PLACE A 1/2 PEA SIZE AMOUNT ON Q TIP OR FINGER COT AND INSERT IN ANAL CANAL QID FOR 3 MOS. 30 g 1  . ranitidine (ZANTAC) 150 MG tablet Take 1 tablet (150 mg total) by mouth 2 (two) times daily.     No current facility-administered medications for this visit.     Past Surgical History  Procedure Laterality Date  .  Appendectomy    . Circumcision    . Anterior cervical disectomy    . Colonoscopy  11/21/2011    MOD IH    . Dg arthro thumb*l*      removal of cyst  . Flexible sigmoidoscopy N/A 04/12/2013    HEMRRHOID BANDING X3.   . Hemorrhoid banding N/A 04/12/2013    Procedure: HEMORRHOID BANDING;  Surgeon: Danie Binder, MD;  Location: [facility];  Service: Endoscopy;  Laterality: N/A;     No Known Allergies    Family History  Problem Relation Age of Onset  . Liver disease Brother     ? etiology     Social History Mr. Patella reports that he has never smoked. He has never used smokeless tobacco. Mr. Thoman reports that he drinks alcohol.   Review of Systems CONSTITUTIONAL: No weight loss, fever, chills, weakness or fatigue.  HEENT: Eyes: No visual loss, blurred vision, double vision or yellow sclerae.No hearing loss, sneezing, congestion, runny nose or sore throat.  SKIN: No rash or itching.  CARDIOVASCULAR: per HPI RESPIRATORY: No cough or sputum.  GASTROINTESTINAL: No anorexia, nausea, vomiting or diarrhea. No abdominal pain or blood.  GENITOURINARY: No burning on urination, no polyuria NEUROLOGICAL:  No headache, dizziness, syncope, paralysis, ataxia, numbness or tingling in the extremities. No change in bowel or bladder control.  MUSCULOSKELETAL: No muscle, back pain, joint pain or stiffness.  LYMPHATICS: No enlarged nodes. No history of splenectomy.  PSYCHIATRIC: No history of depression or anxiety.  ENDOCRINOLOGIC: No reports of sweating, cold or heat intolerance. No polyuria or polydipsia.  Marland Kitchen   Physical Examination p 62 bp 121/85 Wt 285 lbs BMI 37 Gen: resting comfortably, no acute distress HEENT: no scleral icterus, pupils equal round and reactive, no palptable cervical adenopathy,  CV: RRR, no m/r/g, no JVD, no carotid bruits Resp: Clear to auscultation bilaterally GI: abdomen is soft, non-tender, non-distended, normal bowel sounds, no hepatosplenomegaly MSK:  extremities are warm, no edema.  Skin: warm, no rash Neuro:  no focal deficits Psych: appropriate affect   Diagnostic Studies 05/07/13 EKG: NSR, rate 64, non-spec ST/T changes   09/2012 EKG  NSR, rate 48, pr 135ms, no block  05/2013 Echo LVEF 60-65%, mild LVH, normal diastolic  07/8830 Stress echo Stress results: Maximal heart rate during stress was 157bpm (97% of maximal predicted heart rate). The maximal predicted heart rate was 173bpm.The target heart rate was achieved. The heart rate response to stress was normal. There was a normal resting blood pressure with an appropriate response to stress. The rate-pressure product for the peak heart rate and blood pressure was 27920mm Hg/min. The patient experienced no chest pain during stress. Functional capacity was normal.  ------------------------------------------------------------ Baseline:  - The estimated LV ejection fraction was 55%. - Hypokinesis of the distallateral and apical LV myocardium. Immediate post stress:  - LV global systolic function was vigorous. - Normal wall motion; no LV regional wall motion abnormalities.  02/2014 PFTs Mild obstruction consistent with mild asthma/COPD  Assessment and Plan   1. 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 Low heart rates  - noted previously, by last few visits rates have been normal. - normal chronotropic response with exercise by recent GXT - no further workup indicated   F/u 1 year  Arnoldo Lenis, M.D.

## 2014-05-02 ENCOUNTER — Encounter: Payer: Self-pay | Admitting: Gastroenterology

## 2015-01-12 ENCOUNTER — Encounter: Payer: Self-pay | Admitting: Gastroenterology

## 2015-01-12 ENCOUNTER — Ambulatory Visit (INDEPENDENT_AMBULATORY_CARE_PROVIDER_SITE_OTHER): Payer: Managed Care, Other (non HMO) | Admitting: Gastroenterology

## 2015-01-12 VITALS — BP 114/64 | HR 57 | Temp 97.7°F | Ht 74.0 in | Wt 269.6 lb

## 2015-01-12 DIAGNOSIS — K6289 Other specified diseases of anus and rectum: Secondary | ICD-10-CM

## 2015-01-12 DIAGNOSIS — K5901 Slow transit constipation: Secondary | ICD-10-CM | POA: Diagnosis not present

## 2015-01-12 DIAGNOSIS — K921 Melena: Secondary | ICD-10-CM | POA: Diagnosis not present

## 2015-01-12 DIAGNOSIS — K219 Gastro-esophageal reflux disease without esophagitis: Secondary | ICD-10-CM | POA: Diagnosis not present

## 2015-01-12 NOTE — Assessment & Plan Note (Signed)
SYMPTOMS CONTROLLED/RESOLVED WITH DIET CHANGE.  DRINK WATER TO KEEP YOUR URINE LIGHT YELLOW. CONTINUE A HIGH FIBER DIET. CALL WITH QUESTIONS OR CONCERNS REGARDINGCONSTIPATION. FOLLOW UP IN NOV 2017 OR 2018.

## 2015-01-12 NOTE — Assessment & Plan Note (Signed)
SYMPTOMS CONTROLLED/RESOLVED.  CONTINUE TO MONITOR SYMPTOMS. 

## 2015-01-12 NOTE — Patient Instructions (Signed)
DRINK WATER TO KEEP YOUR URINE LIGHT YELLOW.  CONTINUE A HIGH FIBER DIET.  AVOID TRIGGERS FOR REFLUX. SEE INFO BELOW.  PLEASE CALL WITH QUESTIONS OR CONCERNS REGARDING REFLUX OR HEMORRHOIDS.  FOLLOW UP IN NOV 2017 OR 2018.    Lifestyle and home remedies TO HELP CONTROL HEARTBURN.  You may eliminate or reduce the frequency of heartburn by making the following lifestyle changes:  . Control your weight. Being overweight is a major risk factor for heartburn and GERD. Excess pounds put pressure on your abdomen, pushing up your stomach and causing acid to back up into your esophagus.   . Eat smaller meals. 4 TO 6 MEALS A DAY. This reduces pressure on the lower esophageal sphincter, helping to prevent the valve from opening and acid from washing back into your esophagus.   Dolphus Jenny your belt. Clothes that fit tightly around your waist put pressure on your abdomen and the lower esophageal sphincter.  .  . Eliminate heartburn triggers. Everyone has specific triggers. Common triggers such as fatty or fried foods, spicy food, tomato sauce, carbonated beverages, alcohol, chocolate, mint, garlic, onion, caffeine and nicotine may make heartburn worse.   Marland Kitchen Avoid stooping or bending. Tying your shoes is OK. Bending over for longer periods to weed your garden isn't, especially soon after eating.   . Don't lie down after a meal. Wait at least three to four hours after eating before going to bed, and don't lie down right after eating.   Marland Kitchen PLACE THE HEAD OF YOUR BED ON 6 INCH BLOCKS.  Alternative medicine . Several home remedies exist for treating GERD, but they provide only temporary relief. They include drinking baking soda (sodium bicarbonate) added to water or drinking other fluids such as baking soda mixed with cream of tartar and water. . Although these liquids create temporary relief by neutralizing, washing away or buffering acids, eventually they aggravate the situation by adding gas and fluid to  your stomach, increasing pressure and causing more acid reflux. Further, adding more sodium to your diet may increase your blood pressure and add stress to your heart, and excessive bicarbonate ingestion can alter the acid-base balance in your body.

## 2015-01-12 NOTE — Assessment & Plan Note (Signed)
SYMPTOMS CONTROLLED/RESOLVED.  DRINK WATER TO KEEP YOUR URINE LIGHT YELLOW. CONTINUE A HIGH FIBER DIET. CALL WITH QUESTIONS OR CONCERNS REGARDING HEMORRHOIDS. FOLLOW UP IN NOV 2017 OR 2018.

## 2015-01-12 NOTE — Assessment & Plan Note (Signed)
SYMPTOMS FAIRLY WELL CONTROLLED BY DIET AND AVOIDING TRIGGERS.  CONTINUE TO MONITOR SYMPTOMS. CALL WITH QUESTIONS OR CONCERNS. FOLLOW UP IN 1-2 YEARS.

## 2015-01-12 NOTE — Progress Notes (Signed)
ON RECALL LIST  °

## 2015-01-12 NOTE — Progress Notes (Signed)
   Subjective:    Patient ID: [name], male    DOB: 07/03/1965, 49 y.o.   MRN: [id number]  Purvis Kilts, MD  HPI LAST REMEMBER HAVING HEARTBURN RIGHT AFTER LAST VISIT. BREATHING TEST SHOWS MILD COPD/ASTHMA. CHANGED DIET. STARTED EXERCISING. FEELS BETTER. NO TROUBLE WITH HEMORRHOIDS. NOW HAVING A BM 1-2X/DAY AFTER CHANGING DIET. WHEN HE EATS MORE PROCESSED FOOD, HE HAS LESS BMs. GOT DOWN TO 254 LBS.  PT DENIES FEVER, CHILLS, HEMATOCHEZIA, HEMATEMESIS, nausea, vomiting, melena, diarrhea, CHEST PAIN, SHORTNESS OF BREATH,  CHANGE IN BOWEL IN HABITS, constipation, abdominal pain, problems swallowing, problems with sedation, heartburn or indigestion.   Past Medical History  Diagnosis Date  . Hypertension   . Depression   . Anal fissure   . Anxiety   . H/O hiatal hernia   . GERD (gastroesophageal reflux disease)   . Obesity (BMI 30-39.9) AUG 2013 266 LBS    FEB 2015 284 LBS    Past Surgical History  Procedure Laterality Date  . Appendectomy    . Circumcision    . Anterior cervical disectomy    . Colonoscopy  11/21/2011    MOD IH    . Dg arthro thumb*l*      removal of cyst  . Flexible sigmoidoscopy N/A 04/12/2013    HEMRRHOID BANDING X3.   . Hemorrhoid banding N/A 04/12/2013    Procedure: HEMORRHOID BANDING;  Surgeon: Danie Binder, MD;  Location: [facility];  Service: Endoscopy;  Laterality: N/A;   No Known Allergies  Current Outpatient Prescriptions  Medication Sig Dispense Refill  . acetaminophen (TYLENOL) 500 MG tablet Take 1,000 mg by mouth every 6 (six) hours as needed. Pain    . albuterol (PROAIR HFA) 108 (90 BASE) MCG/ACT inhaler Inhale 1 puff into the lungs every 6 (six) hours as needed for wheezing or shortness of breath. 3.7 g 0  . ALPRAZolam (XANAX) 0.5 MG tablet Take 0.5 mg by mouth at bedtime as needed. For sleep    . aspirin EC 81 MG tablet Take 81 mg by mouth daily.    Marland Kitchen losartan (COZAAR) 100 MG tablet Take 100 mg by mouth daily.    Marland Kitchen lisinopril  (PRINIVIL,ZESTRIL) 10 MG tablet Take 10 mg by mouth daily.     . naproxen sodium (ANAPROX) 550 MG tablet Take 550 mg by mouth daily.    .        Review of Systems PER HPI OTHERWISE ALL SYSTEMS ARE NEGATIVE.     Objective:   Physical Exam  Constitutional: He is oriented to person, place, and time. He appears well-developed and well-nourished. No distress.  HENT:  Head: Normocephalic and atraumatic.  Mouth/Throat: Oropharynx is clear and moist. No oropharyngeal exudate.  Eyes: Pupils are equal, round, and reactive to light. No scleral icterus.  Neck: Normal range of motion. Neck supple.  Cardiovascular: Normal rate, regular rhythm and normal heart sounds.   Pulmonary/Chest: Effort normal and breath sounds normal. No respiratory distress.  Abdominal: Soft. Bowel sounds are normal. He exhibits no distension. There is no tenderness.  Musculoskeletal: He exhibits no edema.  Lymphadenopathy:    He has no cervical adenopathy.  Neurological: He is alert and oriented to person, place, and time.  NO FOCAL DEFICITS   Psychiatric: He has a normal mood and affect.  Vitals reviewed.         Assessment & Plan:

## 2015-01-13 NOTE — Progress Notes (Signed)
cc'ed to pcp °

## 2015-09-29 ENCOUNTER — Other Ambulatory Visit (HOSPITAL_COMMUNITY): Payer: Self-pay | Admitting: Registered Nurse

## 2015-09-29 ENCOUNTER — Ambulatory Visit (HOSPITAL_COMMUNITY)
Admission: RE | Admit: 2015-09-29 | Discharge: 2015-09-29 | Disposition: A | Discharge status: Home / Self Care | Payer: 59 | Source: Ambulatory Visit | Attending: Registered Nurse | Admitting: Registered Nurse

## 2015-09-29 ENCOUNTER — Encounter (HOSPITAL_COMMUNITY): Payer: Self-pay

## 2015-09-29 DIAGNOSIS — M79605 Pain in left leg: Secondary | ICD-10-CM

## 2015-09-29 DIAGNOSIS — X58XXXA Exposure to other specified factors, initial encounter: Secondary | ICD-10-CM | POA: Insufficient documentation

## 2015-09-29 DIAGNOSIS — M25462 Effusion, left knee: Secondary | ICD-10-CM | POA: Insufficient documentation

## 2015-09-29 DIAGNOSIS — S8992XA Unspecified injury of left lower leg, initial encounter: Secondary | ICD-10-CM

## 2015-10-02 ENCOUNTER — Ambulatory Visit (HOSPITAL_COMMUNITY): Payer: 59

## 2015-10-17 ENCOUNTER — Telehealth: Payer: Self-pay | Admitting: Orthopaedic Surgery

## 2015-10-17 NOTE — Telephone Encounter (Signed)
Patient called today asking if someone from this office had called him.  I told him that I was unsure but that if he needed to schedule an appointment I would be happy to do that for him.  I told him that we could get him in this week to be seen.  He said he had seen [name] for back pain/sciatica but that he was under the impression that he would see him back in a month and then decide whether to do ortho referral.  [name] said that right now he did not want to mess up his perfect attendance at work and he just wanted to wait until he sees [name].  I told him this was fine but if he changed his mind we would gladly see him.

## 2015-11-28 ENCOUNTER — Telehealth: Payer: Self-pay | Admitting: Cardiology

## 2015-11-28 NOTE — Telephone Encounter (Signed)
Numerous reminders have been mailed to patient. Unable to reach patient.   03/18/2014 10:26 AM New [10]     [System] 12/03/2014 11:01 PM Notification Sent [20]   [name] [id number] 04/27/2015 2:47 PM Notification Sent [20]   [name] [id number] 05/29/2015 3:43 PM Notification Sent [20]   [name] [id number] 07/05/2015 4:10 PM Notification Sent [20]

## 2015-12-14 ENCOUNTER — Encounter: Payer: Self-pay | Admitting: Gastroenterology

## 2016-03-06 DIAGNOSIS — M545 Low back pain: Secondary | ICD-10-CM | POA: Diagnosis not present

## 2016-03-06 DIAGNOSIS — R52 Pain, unspecified: Secondary | ICD-10-CM | POA: Diagnosis not present

## 2016-03-06 DIAGNOSIS — M5186 Other intervertebral disc disorders, lumbar region: Secondary | ICD-10-CM | POA: Diagnosis not present

## 2016-03-06 DIAGNOSIS — Z79899 Other long term (current) drug therapy: Secondary | ICD-10-CM | POA: Diagnosis not present

## 2016-03-06 DIAGNOSIS — I10 Essential (primary) hypertension: Secondary | ICD-10-CM | POA: Diagnosis not present

## 2016-03-06 DIAGNOSIS — M5489 Other dorsalgia: Secondary | ICD-10-CM | POA: Diagnosis not present

## 2016-03-06 DIAGNOSIS — M5136 Other intervertebral disc degeneration, lumbar region: Secondary | ICD-10-CM | POA: Diagnosis not present

## 2016-07-12 DIAGNOSIS — I10 Essential (primary) hypertension: Secondary | ICD-10-CM | POA: Diagnosis not present

## 2016-07-12 DIAGNOSIS — E782 Mixed hyperlipidemia: Secondary | ICD-10-CM | POA: Diagnosis not present

## 2016-09-12 DIAGNOSIS — Z1389 Encounter for screening for other disorder: Secondary | ICD-10-CM | POA: Diagnosis not present

## 2016-09-12 DIAGNOSIS — E782 Mixed hyperlipidemia: Secondary | ICD-10-CM | POA: Diagnosis not present

## 2016-09-12 DIAGNOSIS — Z0001 Encounter for general adult medical examination with abnormal findings: Secondary | ICD-10-CM | POA: Diagnosis not present

## 2016-09-12 DIAGNOSIS — R7309 Other abnormal glucose: Secondary | ICD-10-CM | POA: Diagnosis not present

## 2016-09-12 DIAGNOSIS — I10 Essential (primary) hypertension: Secondary | ICD-10-CM | POA: Diagnosis not present

## 2016-11-01 DIAGNOSIS — R300 Dysuria: Secondary | ICD-10-CM | POA: Diagnosis not present

## 2017-02-19 ENCOUNTER — Emergency Department (HOSPITAL_COMMUNITY)
Admission: EM | Admit: 2017-02-19 | Discharge: 2017-02-19 | Disposition: A | Discharge status: Home / Self Care | Payer: Worker's Compensation | Attending: Emergency Medicine | Admitting: Emergency Medicine

## 2017-02-19 ENCOUNTER — Encounter (HOSPITAL_COMMUNITY): Payer: Self-pay | Admitting: Emergency Medicine

## 2017-02-19 ENCOUNTER — Other Ambulatory Visit: Payer: Self-pay

## 2017-02-19 ENCOUNTER — Emergency Department (HOSPITAL_COMMUNITY): Payer: Worker's Compensation

## 2017-02-19 DIAGNOSIS — Y9389 Activity, other specified: Secondary | ICD-10-CM | POA: Insufficient documentation

## 2017-02-19 DIAGNOSIS — S61219A Laceration without foreign body of unspecified finger without damage to nail, initial encounter: Secondary | ICD-10-CM

## 2017-02-19 DIAGNOSIS — S61412A Laceration without foreign body of left hand, initial encounter: Secondary | ICD-10-CM | POA: Diagnosis not present

## 2017-02-19 DIAGNOSIS — W3182XA Contact with other commercial machinery, initial encounter: Secondary | ICD-10-CM | POA: Diagnosis not present

## 2017-02-19 DIAGNOSIS — Y9263 Factory as the place of occurrence of the external cause: Secondary | ICD-10-CM | POA: Insufficient documentation

## 2017-02-19 DIAGNOSIS — I10 Essential (primary) hypertension: Secondary | ICD-10-CM | POA: Diagnosis not present

## 2017-02-19 DIAGNOSIS — S66121A Laceration of flexor muscle, fascia and tendon of left index finger at wrist and hand level, initial encounter: Secondary | ICD-10-CM | POA: Insufficient documentation

## 2017-02-19 DIAGNOSIS — Y990 Civilian activity done for income or pay: Secondary | ICD-10-CM | POA: Diagnosis not present

## 2017-02-19 DIAGNOSIS — Z23 Encounter for immunization: Secondary | ICD-10-CM | POA: Insufficient documentation

## 2017-02-19 DIAGNOSIS — S66123A Laceration of flexor muscle, fascia and tendon of left middle finger at wrist and hand level, initial encounter: Secondary | ICD-10-CM | POA: Insufficient documentation

## 2017-02-19 DIAGNOSIS — S6992XA Unspecified injury of left wrist, hand and finger(s), initial encounter: Secondary | ICD-10-CM | POA: Diagnosis present

## 2017-02-19 MED ORDER — OXYCODONE-ACETAMINOPHEN 5-325 MG PO TABS
2.0000 | ORAL_TABLET | Freq: Once | ORAL | Status: AC
  Administered 2017-02-19: 2 via ORAL
  Filled 2017-02-19: qty 2

## 2017-02-19 MED ORDER — TETANUS-DIPHTH-ACELL PERTUSSIS 5-2.5-18.5 LF-MCG/0.5 IM SUSP
0.5000 mL | Freq: Once | INTRAMUSCULAR | Status: AC
  Administered 2017-02-19: 0.5 mL via INTRAMUSCULAR
  Filled 2017-02-19: qty 0.5

## 2017-02-19 MED ORDER — HYDROCODONE-ACETAMINOPHEN 5-325 MG PO TABS: 1.0000 | ORAL_TABLET | ORAL | 0 refills | Status: DC | PRN

## 2017-02-19 MED ORDER — CEPHALEXIN 500 MG PO CAPS: 500.0000 mg | ORAL_CAPSULE | Freq: Four times a day (QID) | ORAL | 0 refills | Status: DC

## 2017-02-19 MED ORDER — LIDOCAINE HCL (PF) 1 % IJ SOLN: 30.0000 mL | Freq: Once | INTRAMUSCULAR | Status: DC

## 2017-02-19 MED ORDER — BUPIVACAINE HCL (PF) 0.25 % IJ SOLN
20.0000 mL | Freq: Once | INTRAMUSCULAR | Status: AC
  Administered 2017-02-19: 20 mL
  Filled 2017-02-19: qty 30

## 2017-02-19 NOTE — ED Provider Notes (Signed)
[facility] EMERGENCY DEPARTMENT Provider Note   CSN: [id number] Arrival date & time: 02/19/17  1417     History   Chief Complaint Chief Complaint  Patient presents with  . Laceration    HPI [name] is a 51 y.o. male.  HPI   Patient is a 51 year old male with a history of anxiety, depression, GERD, and hypertension presenting for a laceration to fingers 2 3 and 4 of his left hand.  Patient works in a [employer] and his hand was lacerated by an Interior and spatial designer earlier today.  Patient reports that he does have sensation past the point of injury.  Patient has been able to move his hand and bend his fingers but with pain.  Bleeding controlled with compression.  Tetanus shot is not up-to-date.  Past Medical History:  Diagnosis Date  . Anal fissure   . Anxiety   . Depression   . GERD (gastroesophageal reflux disease)   . H/O hiatal hernia   . Hypertension   . Obesity (BMI 30-39.9) AUG 2013 266 LBS   FEB 2015 284 LBS    Patient Active Problem List   Diagnosis Date Noted  . GERD (gastroesophageal reflux disease) 01/12/2015  . Constipation 04/07/2013  . 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 Hematochezia 10/18/2011    Past Surgical History:  Procedure Laterality Date  . anterior cervical disectomy    . APPENDECTOMY    . CIRCUMCISION    . COLONOSCOPY  11/21/2011   MOD IH    . DG ARTHRO THUMB*L*     removal of cyst  . FLEXIBLE SIGMOIDOSCOPY N/A 04/12/2013   HEMRRHOID BANDING X3.   . HEMORRHOID BANDING N/A 04/12/2013   Procedure: HEMORRHOID BANDING;  Surgeon: Danie Binder, MD;  Location: [facility];  Service: Endoscopy;  Laterality: N/A;       Home Medications    Prior to Admission medications   Medication Sig Start Date End Date Taking? Authorizing Provider  acetaminophen (TYLENOL) 500 MG tablet Take 1,000 mg by mouth every 6 (six) hours as needed. Pain    [provider]  albuterol (PROAIR HFA) 108 (90 BASE) MCG/ACT inhaler Inhale 1 puff into the lungs  every 6 (six) hours as needed for wheezing or shortness of breath. 03/10/14   Arnoldo Lenis, MD  ALPRAZolam Duanne Moron) 0.5 MG tablet Take 0.5 mg by mouth at bedtime as needed. For sleep    [provider]  aspirin EC 81 MG tablet Take 81 mg by mouth daily.    [provider]  lisinopril (PRINIVIL,ZESTRIL) 10 MG tablet Take 10 mg by mouth daily.  10/13/11   [provider]  losartan (COZAAR) 100 MG tablet Take 100 mg by mouth daily.    [provider]  naproxen sodium (ANAPROX) 550 MG tablet Take 550 mg by mouth daily.    [provider]  ranitidine (ZANTAC) 150 MG tablet Take 1 tablet (150 mg total) by mouth 2 (two) times daily. Patient not taking: Reported on 01/12/2015 07/05/13   Arnoldo Lenis, MD    Family History Family History  Problem Relation Age of Onset  . Liver disease Brother        ? etiology    Social History Social History   Tobacco Use  . Smoking status: Never Smoker  . Smokeless tobacco: Never Used  Substance Use Topics  . Alcohol use: Yes    Alcohol/week: 0.0 oz    Comment: socially, coupled rinks/yr  . Drug use: No  Allergies   Patient has no known allergies.   Review of Systems Review of Systems  Skin: Positive for wound.  Neurological: Negative for weakness and numbness.     Physical Exam Updated Vital Signs BP 135/69 (BP Location: Right Arm)   Pulse 63   Temp 99.1 F (37.3 C) (Oral)   Resp 16   Ht 6\' 2"  (1.88 m)   Wt 129.3 kg (285 lb)   SpO2 100%   BMI 36.59 kg/m   Physical Exam  Constitutional: He appears well-developed and well-nourished. No distress.  Sitting comfortably in bed.  HENT:  Head: Normocephalic and atraumatic.  Eyes: Conjunctivae are normal. Right eye exhibits no discharge. Left eye exhibits no discharge.  EOMs normal to gross examination.  Neck: Normal range of motion.  Cardiovascular: Normal rate and regular rhythm.  Intact, 2+ radial and ulnar pulses of the left  upper extremity.  Pulmonary/Chest:  Normal respiratory effort. Patient converses comfortably. No audible wheeze or stridor.  Abdominal: He exhibits no distension.  Musculoskeletal: Normal range of motion.  Left Hand Exam:  Inspection: Please see image below for injury.  Patient exhibits lacerations to the flexor surface of digits 2, 3, and 4.  There is a distal phalanx laceration to digit 3.  There is tendon exposure in digits 2 and 3. There does not appear to be a tendon laceration in these digits in exploration through ROM. ROM: Passive/active ROM intact at wrist, MCP, PIP, and DIP joints, thumb MCP and IP joints, and no rotational deformity of metacarpals noted. Ligamentous stability: No laxity to valgus/varus stress of MCP, PIP, or DIP joints. No joint laxity with radial stress of thumb. Flexor/Extensor tendons: FDS/FDP tendons intact in digits 2-5 at PIP/DIP joints, respectively; extensor tendons intact in all digits Nerve testing:  Patient exhibits decreased sensation on the ulnar aspects of digits 2 and 3.  Loss of sensation is more significant on the ulnar aspect of digit 3.   Vascular: 2+ radial and ulnar pulses. Capillary refill <2 seconds b/l.  Neurological: He is alert.  Cranial nerves intact to gross observation. Patient moves extremities without difficulty.  Skin: Skin is warm and dry. He is not diaphoretic.  Psychiatric: He has a normal mood and affect. His behavior is normal. Judgment and thought content normal.  Nursing note and vitals reviewed.      ED Treatments / Results  Labs (all labs ordered are listed, but only abnormal results are displayed) Labs Reviewed - No data to display  EKG  EKG Interpretation None       Radiology Dg Hand Complete Left  Result Date: 02/19/2017 CLINICAL DATA:  Finger lacerations after sticking hand into fan. EXAM: LEFT HAND - COMPLETE 3+ VIEW COMPARISON:  None. FINDINGS: Lacerations are seen involving the volar soft tissues of  the distal portions of the third, fourth and fifth fingers. No radiopaque foreign body is noted. There is seen at defect involving the distal tuft of the third distal phalanx with associated bone fragments which appears to represent fracture of indeterminate age. No other bony abnormality is noted. IMPRESSION: Lacerations are seen involving the volar soft tissues of the third, fourth and fifth fingers without underlying fracture. Defect is noted in the distal tuft of the third distal phalanx with adjacent bone fragments concerning for fracture of indeterminate age. Electronically Signed   By: Marijo Conception, M.D.   On: 02/19/2017 15:15    Procedures Procedures (including critical care time)  Medications Ordered in ED Medications  bupivacaine (PF) (MARCAINE) 0.25 % injection 20 mL (20 mLs Infiltration Given 02/19/17 1626)  oxyCODONE-acetaminophen (PERCOCET/ROXICET) 5-325 MG per tablet 2 tablet (2 tablets Oral Given 02/19/17 1626)  Tdap (BOOSTRIX) injection 0.5 mL (0.5 mLs Intramuscular Given 02/19/17 1627)     Initial Impression / Assessment and Plan / ED Course  I have reviewed the triage vital signs and the nursing notes.  Pertinent labs & imaging results that were available during my care of the patient were reviewed by me and considered in my medical decision making (see chart for details).    Final Clinical Impressions(s) / ED Diagnoses   Final diagnoses:  Laceration of left hand without foreign body, initial encounter   Due to the extent of injury and tendon exposure, will initiate consult to hand.  Pain to be controlled at this time with Percocet pending digital block.  There appears to be a tuft fracture on the distal tip of digit 3.  This is indeterminate age, and after questioning the patient, he does have a history of a tuft fracture in this digit. Patient with possible digital nerve injury to ulnar side of 2nd and 3rd digits of right hand.   Spoke with Dr. Aline Brochure of  orthopedic surgery who recommended that the patient be followed by hand surgery and [facility].  Initiated consult to Dr. Lenon Curt.  Patient case discussed with Dr. Merrily Pew.   Spoke with Dr. Lenon Curt who requested that patient be evaluated by Dr. Aline Brochure.  On further conversation Dr. Aline Brochure, requested that we tacked down the flaps of the hand and patient is to follow-up with Dr. Lenon Curt tomorrow.  I discussed this with the patient and his wife.  The case was signed out to Lily Kocher, PA-C at 6:15 PM to complete loose primary closure.  Keflex for prophylaxis.  ED Discharge Orders    None       Tamala Julian 02/19/17 1818    Tamala Julian 02/19/17 1818    Merrily Pew, MD 02/19/17 [phone]

## 2017-02-19 NOTE — ED Notes (Signed)
Taken to outpatient lab for drug test.

## 2017-02-19 NOTE — ED Provider Notes (Signed)
LACERATION REPAIRS LEFT HAND  Patient is a 51 year old male who sustained multiple lacerations to multiple fingers of the left hand with an industrial fan.  I discussed with the patient the need to have these loosely closed, as he will be seeing a hand specialist on tomorrow.  I discussed the procedure in terms which he understands, and he gives permission to proceed with the closure of these lacerations.  Patient identified by armband.  Timeout taken before beginning the procedure.  The area was sprayed with safe-cleanse.  The lacerations to the left index finger, middle finger, and ring finger were infiltrated with 0.25% plain bupivacaine.  After adequate anesthetic, the wounds were irrigated copiously with saline.  The wounds were then inspected for foreign body and any debris was removed.  Tendon exposure is noted.  Using sterile technique the laceration flap to the index finger was repaired loosely with 2 interrupted sutures of 4-0 Ethilon.  The wound measures 2.4 cm.  The middle finger has 2 lacerations.  The more distal laceration measures 2 cm.  It was repaired loosely with 3 interrupted sutures of 4-0 nylon.  The second laceration measured 2.4 cm and was repaired loosely with 4 interrupted sutures of 4-0 nylon.  The ring finger measured 1.3 cm.  It was repaired with 3 interrupted sutures of 4-0 nylon.  Following this Vaseline impregnated gauze was used to wrap the laceration areas.  A bulky dressing was applied to the hand.  The patient tolerated the procedure without problem.  Patient will be treated with Norco to assist with pain, he will see Dr. Lenon Curt on tomorrow for hand surgery evaluation.   Lily Kocher, PA-C 02/19/17 2031    Merrily Pew, MD 02/19/17 [phone]

## 2017-02-19 NOTE — ED Triage Notes (Signed)
Pt cut left 3 last fingers on an industrial fan at work. 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

## 2017-02-19 NOTE — Discharge Instructions (Addendum)
Please keep your wound to your left hand clean and dry.  Please see Dr. Lenon Curt on tomorrow for hand surgery evaluation.  Keep your hand elevated as much as possible.  May use Tylenol every 4 hours.  May use Norco every 4 hours for more severe pain.  Norco may cause drowsiness.  Please do not drive, drink alcohol, or operate machinery while taking this medication.  When you call the hand specialist office, let them know that Dr. Lenon Curt is expecting you.

## 2017-02-19 NOTE — ED Provider Notes (Signed)
Medical screening examination/treatment/procedure(s) were conducted as a shared visit with non-physician practitioner(s) and myself.  I personally evaluated the patient during the encounter.  51 year old male unknown last tetanus cut his left hand volar surface of middle 3 digits on a fan at work.  Has decreased sensation in his middle finger.  Exposed tendon but no obvious tendon injuries on full range of motion.  Able to make a fist and fully extend his hand. I became involved in the patient's care after Dr. Aline Brochure referred the patient down to hand surgeon in [place] secondary to the questionable nerve injury.  On-call Copy in [place], Dr. Lenon Curt, requested that the patient be evaluated by whoever is on-call for hand prior to him seeing them.  After this I discussed with Dr. Aline Brochure the situation and he stated he did not think the patient need to be seen tonight, which I agree with, but also stated that he did not have the ability to fix a nerve injury and the patient needed more specialized care that he can offer here in [place].  With this he refused to see the patient or follow them up.  I discussed with Dr. Lenon Curt again in [place] with this information and he agrees to see the patient in consultation tomorrow.  He requested that we rinse it out well and loosely close the wounds and he will provide definitive care tomorrow.    Merrily Pew, MD 02/19/17 [phone]

## 2017-02-19 NOTE — ED Notes (Addendum)
Area uncovered and it is pointer, middle and ring finger lacerations to anterior side with bleeding controlled. Nad. Able to move fingers.

## 2017-02-20 MED FILL — Hydrocodone-Acetaminophen Tab 5-325 MG: ORAL | Qty: 6 | Status: AC

## 2017-02-21 ENCOUNTER — Other Ambulatory Visit: Payer: Self-pay

## 2017-02-21 ENCOUNTER — Encounter (HOSPITAL_BASED_OUTPATIENT_CLINIC_OR_DEPARTMENT_OTHER)
Admission: RE | Disposition: A | Discharge status: Home / Self Care | Payer: Self-pay | Source: Ambulatory Visit | Attending: Orthopedic Surgery

## 2017-02-21 ENCOUNTER — Ambulatory Visit (HOSPITAL_BASED_OUTPATIENT_CLINIC_OR_DEPARTMENT_OTHER): Payer: Worker's Compensation | Admitting: Anesthesiology

## 2017-02-21 ENCOUNTER — Other Ambulatory Visit: Payer: Self-pay | Admitting: Orthopedic Surgery

## 2017-02-21 ENCOUNTER — Ambulatory Visit (HOSPITAL_BASED_OUTPATIENT_CLINIC_OR_DEPARTMENT_OTHER)
Admission: RE | Admit: 2017-02-21 | Discharge: 2017-02-21 | Disposition: A | Discharge status: Home / Self Care | Payer: Worker's Compensation | Source: Ambulatory Visit | Attending: Orthopedic Surgery | Admitting: Orthopedic Surgery

## 2017-02-21 ENCOUNTER — Encounter (HOSPITAL_BASED_OUTPATIENT_CLINIC_OR_DEPARTMENT_OTHER): Payer: Self-pay | Admitting: *Deleted

## 2017-02-21 DIAGNOSIS — Z79899 Other long term (current) drug therapy: Secondary | ICD-10-CM | POA: Insufficient documentation

## 2017-02-21 DIAGNOSIS — F419 Anxiety disorder, unspecified: Secondary | ICD-10-CM | POA: Diagnosis not present

## 2017-02-21 DIAGNOSIS — S60032A Contusion of left middle finger without damage to nail, initial encounter: Secondary | ICD-10-CM | POA: Insufficient documentation

## 2017-02-21 DIAGNOSIS — K449 Diaphragmatic hernia without obstruction or gangrene: Secondary | ICD-10-CM | POA: Diagnosis not present

## 2017-02-21 DIAGNOSIS — S65012A Laceration of ulnar artery at wrist and hand level of left arm, initial encounter: Secondary | ICD-10-CM | POA: Diagnosis not present

## 2017-02-21 DIAGNOSIS — S61211A Laceration without foreign body of left index finger without damage to nail, initial encounter: Secondary | ICD-10-CM | POA: Insufficient documentation

## 2017-02-21 DIAGNOSIS — Z791 Long term (current) use of non-steroidal anti-inflammatories (NSAID): Secondary | ICD-10-CM | POA: Insufficient documentation

## 2017-02-21 DIAGNOSIS — Z9889 Other specified postprocedural states: Secondary | ICD-10-CM | POA: Insufficient documentation

## 2017-02-21 DIAGNOSIS — S66123A Laceration of flexor muscle, fascia and tendon of left middle finger at wrist and hand level, initial encounter: Secondary | ICD-10-CM | POA: Diagnosis not present

## 2017-02-21 DIAGNOSIS — S66121A Laceration of flexor muscle, fascia and tendon of left index finger at wrist and hand level, initial encounter: Secondary | ICD-10-CM | POA: Insufficient documentation

## 2017-02-21 DIAGNOSIS — Z9049 Acquired absence of other specified parts of digestive tract: Secondary | ICD-10-CM | POA: Insufficient documentation

## 2017-02-21 DIAGNOSIS — Y990 Civilian activity done for income or pay: Secondary | ICD-10-CM | POA: Insufficient documentation

## 2017-02-21 DIAGNOSIS — S61215A Laceration without foreign body of left ring finger without damage to nail, initial encounter: Secondary | ICD-10-CM | POA: Diagnosis not present

## 2017-02-21 DIAGNOSIS — Z6837 Body mass index (BMI) 37.0-37.9, adult: Secondary | ICD-10-CM | POA: Diagnosis not present

## 2017-02-21 DIAGNOSIS — F329 Major depressive disorder, single episode, unspecified: Secondary | ICD-10-CM | POA: Insufficient documentation

## 2017-02-21 DIAGNOSIS — J449 Chronic obstructive pulmonary disease, unspecified: Secondary | ICD-10-CM | POA: Insufficient documentation

## 2017-02-21 DIAGNOSIS — Z7982 Long term (current) use of aspirin: Secondary | ICD-10-CM | POA: Insufficient documentation

## 2017-02-21 DIAGNOSIS — Z85820 Personal history of malignant melanoma of skin: Secondary | ICD-10-CM | POA: Diagnosis not present

## 2017-02-21 DIAGNOSIS — Z87442 Personal history of urinary calculi: Secondary | ICD-10-CM | POA: Insufficient documentation

## 2017-02-21 DIAGNOSIS — W319XXA Contact with unspecified machinery, initial encounter: Secondary | ICD-10-CM | POA: Insufficient documentation

## 2017-02-21 DIAGNOSIS — I10 Essential (primary) hypertension: Secondary | ICD-10-CM | POA: Diagnosis not present

## 2017-02-21 DIAGNOSIS — S64493A Injury of digital nerve of left middle finger, initial encounter: Secondary | ICD-10-CM | POA: Insufficient documentation

## 2017-02-21 DIAGNOSIS — E669 Obesity, unspecified: Secondary | ICD-10-CM | POA: Insufficient documentation

## 2017-02-21 HISTORY — DX: Chronic obstructive pulmonary disease, unspecified: J44.9

## 2017-02-21 HISTORY — DX: Malignant (primary) neoplasm, unspecified: C80.1

## 2017-02-21 HISTORY — DX: Unspecified asthma, uncomplicated: J45.909

## 2017-02-21 HISTORY — PX: NERVE, TENDON AND ARTERY REPAIR: SHX5695

## 2017-02-21 HISTORY — DX: Personal history of urinary calculi: Z87.442

## 2017-02-21 LAB — POCT I-STAT, CHEM 8
BUN: 10 mg/dL (ref 6–20)
Calcium, Ion: 1.25 mmol/L (ref 1.15–1.40)
Chloride: 101 mmol/L (ref 101–111)
Creatinine, Ser: 0.8 mg/dL (ref 0.61–1.24)
Glucose, Bld: 101 mg/dL — ABNORMAL HIGH (ref 65–99)
HCT: 39 % (ref 39.0–52.0)
Hemoglobin: 13.3 g/dL (ref 13.0–17.0)
Potassium: 4 mmol/L (ref 3.5–5.1)
Sodium: 141 mmol/L (ref 135–145)
TCO2: 29 mmol/L (ref 22–32)

## 2017-02-21 SURGERY — NERVE, TENDON AND ARTERY REPAIR
Anesthesia: General | Site: Hand | Laterality: Left

## 2017-02-21 MED ORDER — LACTATED RINGERS IV SOLN
INTRAVENOUS | Status: DC
  Administered 2017-02-21: 13:00:00 via INTRAVENOUS

## 2017-02-21 MED ORDER — PROPOFOL 10 MG/ML IV BOLUS
INTRAVENOUS | Status: AC
  Filled 2017-02-21: qty 20

## 2017-02-21 MED ORDER — BUPIVACAINE HCL (PF) 0.25 % IJ SOLN
INTRAMUSCULAR | Status: DC | PRN
  Administered 2017-02-21: 10 mL

## 2017-02-21 MED ORDER — MIDAZOLAM HCL 2 MG/2ML IJ SOLN
INTRAMUSCULAR | Status: AC
  Filled 2017-02-21: qty 2

## 2017-02-21 MED ORDER — DEXAMETHASONE SODIUM PHOSPHATE 4 MG/ML IJ SOLN: INTRAMUSCULAR | Status: DC | PRN

## 2017-02-21 MED ORDER — FENTANYL CITRATE (PF) 100 MCG/2ML IJ SOLN
INTRAMUSCULAR | Status: DC | PRN
  Administered 2017-02-21 (×2): 25 ug via INTRAVENOUS
  Administered 2017-02-21: 100 ug via INTRAVENOUS

## 2017-02-21 MED ORDER — OXYCODONE HCL 5 MG PO TABS
ORAL_TABLET | ORAL | Status: AC
  Filled 2017-02-21: qty 1

## 2017-02-21 MED ORDER — OXYCODONE-ACETAMINOPHEN 5-325 MG PO TABS: ORAL_TABLET | ORAL | 0 refills | Status: DC

## 2017-02-21 MED ORDER — DEXTROSE 5 % IV SOLN
3.0000 g | INTRAVENOUS | Status: AC
  Administered 2017-02-21: 3 g via INTRAVENOUS

## 2017-02-21 MED ORDER — KETOROLAC TROMETHAMINE 30 MG/ML IJ SOLN
INTRAMUSCULAR | Status: AC
  Filled 2017-02-21: qty 1

## 2017-02-21 MED ORDER — CEFAZOLIN SODIUM-DEXTROSE 1-4 GM/50ML-% IV SOLN
INTRAVENOUS | Status: AC
  Filled 2017-02-21: qty 50

## 2017-02-21 MED ORDER — OXYCODONE HCL 5 MG PO TABS
5.0000 mg | ORAL_TABLET | Freq: Once | ORAL | Status: AC | PRN
  Administered 2017-02-21: 5 mg via ORAL

## 2017-02-21 MED ORDER — PROPOFOL 10 MG/ML IV BOLUS
INTRAVENOUS | Status: DC | PRN
  Administered 2017-02-21: 250 mg via INTRAVENOUS

## 2017-02-21 MED ORDER — HYDROMORPHONE HCL 1 MG/ML IJ SOLN
INTRAMUSCULAR | Status: AC
  Filled 2017-02-21: qty 0.5

## 2017-02-21 MED ORDER — FENTANYL CITRATE (PF) 100 MCG/2ML IJ SOLN
INTRAMUSCULAR | Status: AC
  Filled 2017-02-21: qty 2

## 2017-02-21 MED ORDER — MIDAZOLAM HCL 5 MG/5ML IJ SOLN
INTRAMUSCULAR | Status: DC | PRN
  Administered 2017-02-21: 2 mg via INTRAVENOUS

## 2017-02-21 MED ORDER — SULFAMETHOXAZOLE-TRIMETHOPRIM 800-160 MG PO TABS: 1.0000 | ORAL_TABLET | Freq: Two times a day (BID) | ORAL | 0 refills | Status: DC

## 2017-02-21 MED ORDER — ONDANSETRON HCL 4 MG/2ML IJ SOLN
INTRAMUSCULAR | Status: AC
  Filled 2017-02-21: qty 2

## 2017-02-21 MED ORDER — PROMETHAZINE HCL 25 MG/ML IJ SOLN: 6.2500 mg | INTRAMUSCULAR | Status: DC | PRN

## 2017-02-21 MED ORDER — SCOPOLAMINE 1 MG/3DAYS TD PT72: 1.0000 | MEDICATED_PATCH | Freq: Once | TRANSDERMAL | Status: DC | PRN

## 2017-02-21 MED ORDER — MEPERIDINE HCL 25 MG/ML IJ SOLN: 6.2500 mg | INTRAMUSCULAR | Status: DC | PRN

## 2017-02-21 MED ORDER — KETOROLAC TROMETHAMINE 30 MG/ML IJ SOLN
INTRAMUSCULAR | Status: DC | PRN
  Administered 2017-02-21: 30 mg via INTRAVENOUS

## 2017-02-21 MED ORDER — CHLORHEXIDINE GLUCONATE 4 % EX LIQD: 60.0000 mL | Freq: Once | CUTANEOUS | Status: DC

## 2017-02-21 MED ORDER — DEXAMETHASONE SODIUM PHOSPHATE 4 MG/ML IJ SOLN
INTRAMUSCULAR | Status: DC | PRN
  Administered 2017-02-21: 10 mg via INTRAVENOUS

## 2017-02-21 MED ORDER — MIDAZOLAM HCL 2 MG/2ML IJ SOLN: 1.0000 mg | INTRAMUSCULAR | Status: DC | PRN

## 2017-02-21 MED ORDER — LIDOCAINE 2% (20 MG/ML) 5 ML SYRINGE
INTRAMUSCULAR | Status: DC | PRN
  Administered 2017-02-21: 60 mg via INTRAVENOUS

## 2017-02-21 MED ORDER — HYDROMORPHONE HCL 1 MG/ML IJ SOLN
0.2500 mg | INTRAMUSCULAR | Status: DC | PRN
  Administered 2017-02-21: 0.5 mg via INTRAVENOUS

## 2017-02-21 MED ORDER — CEFAZOLIN SODIUM-DEXTROSE 2-4 GM/100ML-% IV SOLN
INTRAVENOUS | Status: AC
  Filled 2017-02-21: qty 100

## 2017-02-21 MED ORDER — ONDANSETRON HCL 4 MG/2ML IJ SOLN
INTRAMUSCULAR | Status: DC | PRN
  Administered 2017-02-21: 4 mg via INTRAVENOUS

## 2017-02-21 MED ORDER — FENTANYL CITRATE (PF) 100 MCG/2ML IJ SOLN
50.0000 ug | INTRAMUSCULAR | Status: DC | PRN
Start: 2017-02-21 — End: 2017-02-21

## 2017-02-21 MED ORDER — OXYCODONE HCL 5 MG/5ML PO SOLN: 5.0000 mg | Freq: Once | ORAL | Status: AC | PRN

## 2017-02-21 SURGICAL SUPPLY — 63 items
BAG DECANTER FOR FLEXI CONT (MISCELLANEOUS) IMPLANT
BANDAGE ACE 3X5.8 VEL STRL LF (GAUZE/BANDAGES/DRESSINGS) ×2 IMPLANT
BLADE MINI RND TIP GREEN BEAV (BLADE) IMPLANT
BLADE SURG 15 STRL LF DISP TIS (BLADE) ×2 IMPLANT
BLADE SURG 15 STRL SS (BLADE) ×4
BNDG CMPR 9X4 STRL LF SNTH (GAUZE/BANDAGES/DRESSINGS) ×1
BNDG ESMARK 4X9 LF (GAUZE/BANDAGES/DRESSINGS) ×2 IMPLANT
BNDG GAUZE ELAST 4 BULKY (GAUZE/BANDAGES/DRESSINGS) ×2 IMPLANT
BRUSH SCRUB EZ PLAIN DRY (MISCELLANEOUS) IMPLANT
CATH ROBINSON RED A/P 10FR (CATHETERS) IMPLANT
CHLORAPREP W/TINT 26ML (MISCELLANEOUS) IMPLANT
CONNECTOR NERVE AXOGUARD 2X15 (Orthopedic Implant) ×2 IMPLANT
CORD BIPOLAR FORCEPS 12FT (ELECTRODE) ×2 IMPLANT
COVER BACK TABLE 60X90IN (DRAPES) ×2 IMPLANT
COVER MAYO STAND STRL (DRAPES) ×2 IMPLANT
CUFF TOURNIQUET SINGLE 18IN (TOURNIQUET CUFF) ×2 IMPLANT
DECANTER SPIKE VIAL GLASS SM (MISCELLANEOUS) IMPLANT
DRAPE EXTREMITY T 121X128X90 (DRAPE) ×2 IMPLANT
DRAPE SURG 17X23 STRL (DRAPES) ×2 IMPLANT
GAUZE SPONGE 4X4 12PLY STRL (GAUZE/BANDAGES/DRESSINGS) ×2 IMPLANT
GAUZE XEROFORM 1X8 LF (GAUZE/BANDAGES/DRESSINGS) ×2 IMPLANT
GLOVE BIO SURGEON STRL SZ7.5 (GLOVE) ×2 IMPLANT
GLOVE BIOGEL PI IND STRL 8 (GLOVE) ×1 IMPLANT
GLOVE BIOGEL PI IND STRL 8.5 (GLOVE) IMPLANT
GLOVE BIOGEL PI INDICATOR 8 (GLOVE) ×1
GLOVE BIOGEL PI INDICATOR 8.5 (GLOVE)
GLOVE SURG ORTHO 8.0 STRL STRW (GLOVE) IMPLANT
GOWN STRL REUS W/ TWL LRG LVL3 (GOWN DISPOSABLE) ×1 IMPLANT
GOWN STRL REUS W/TWL LRG LVL3 (GOWN DISPOSABLE) ×1
GOWN STRL REUS W/TWL XL LVL3 (GOWN DISPOSABLE) ×2 IMPLANT
LOOP VESSEL MAXI BLUE (MISCELLANEOUS) IMPLANT
NDL SAFETY ECLIPSE 18X1.5 (NEEDLE) IMPLANT
NEEDLE HYPO 18GX1.5 SHARP (NEEDLE)
NEEDLE HYPO 25X1 1.5 SAFETY (NEEDLE) ×2 IMPLANT
NS IRRIG 1000ML POUR BTL (IV SOLUTION) ×2 IMPLANT
PACK BASIN DAY SURGERY FS (CUSTOM PROCEDURE TRAY) ×2 IMPLANT
PAD CAST 3X4 CTTN HI CHSV (CAST SUPPLIES) ×1 IMPLANT
PAD CAST 4YDX4 CTTN HI CHSV (CAST SUPPLIES) IMPLANT
PADDING CAST ABS 4INX4YD NS (CAST SUPPLIES)
PADDING CAST ABS COTTON 4X4 ST (CAST SUPPLIES) IMPLANT
PADDING CAST COTTON 3X4 STRL (CAST SUPPLIES) ×2
PADDING CAST COTTON 4X4 STRL (CAST SUPPLIES)
SLEEVE SCD COMPRESS KNEE MED (MISCELLANEOUS) ×2 IMPLANT
SLING ARM FOAM STRAP LRG (SOFTGOODS) ×2 IMPLANT
SPEAR EYE SURG WECK-CEL (MISCELLANEOUS) ×2 IMPLANT
SPLINT PLASTER CAST XFAST 3X15 (CAST SUPPLIES) IMPLANT
SPLINT PLASTER XTRA FASTSET 3X (CAST SUPPLIES)
STOCKINETTE 4X48 STRL (DRAPES) ×2 IMPLANT
SUT CHROMIC 5 0 RB 1 27 (SUTURE) ×2 IMPLANT
SUT ETHIBOND 3-0 V-5 (SUTURE) IMPLANT
SUT ETHILON 4 0 PS 2 18 (SUTURE) ×2 IMPLANT
SUT FIBERWIRE 4-0 18 TAPR NDL (SUTURE)
SUT NYLON 9 0 VRM6 (SUTURE) ×6 IMPLANT
SUT PROLENE 6 0 P 1 18 (SUTURE) IMPLANT
SUT SILK 4 0 PS 2 (SUTURE) ×2 IMPLANT
SUT SUPRAMID 4-0 (SUTURE) IMPLANT
SUT VICRYL 4-0 PS2 18IN ABS (SUTURE) IMPLANT
SUTURE FIBERWR 4-0 18 TAPR NDL (SUTURE) IMPLANT
SYR BULB 3OZ (MISCELLANEOUS) ×2 IMPLANT
SYR CONTROL 10ML LL (SYRINGE) IMPLANT
TOWEL OR 17X24 6PK STRL BLUE (TOWEL DISPOSABLE) ×4 IMPLANT
TRAY DSU PREP LF (CUSTOM PROCEDURE TRAY) ×2 IMPLANT
UNDERPAD 30X30 (UNDERPADS AND DIAPERS) ×2 IMPLANT

## 2017-02-21 NOTE — Op Note (Signed)
NAMEEDWING, FIGLEY               ACCOUNT NO.:  [id number]  MEDICAL RECORD NO.:  [id number]  LOCATION:                                 FACILITY:  PHYSICIAN:  Leanora Cover, MD        DATE OF BIRTH:  08-Jul-1965  DATE OF PROCEDURE:  02/21/2017 DATE OF DISCHARGE:                              OPERATIVE REPORT   PREOPERATIVE DIAGNOSIS:  Left index, long, and ring finger lacerations with possible tendon, artery, and nerve laceration.  POSTOPERATIVE DIAGNOSES:  Left index finger ulnar digital artery thrombosis and nerve contusion, left long finger ulnar digital artery laceration and ulnar digital nerve partial laceration, and left ring finger soft tissue laceration.  PROCEDURE:   1. Exploration of wounds of index, long, and ring fingers with repair of intermediate wound of ring finger measuring 1.5 cm 2. Repair of left long finger ulnar digital artery  under microscope 3. Repair of left long finger ulnar digital nerve under microscope with nerve wrap 4. Left index finger excision of thrombosed ulnar digital artery with direct repair of ulnar digital artery.   5. Left index finger repair of flexor tendon sheath 6. Left long finger repair of flexor tendon sheath  SURGEON:  Leanora Cover, MD.  ASSISTANT:  Daryll Brod, MD.  ANESTHESIA:  General.  IV FLUIDS:  Per anesthesia flow sheet.  ESTIMATED BLOOD LOSS:  Minimal.  COMPLICATIONS:  None.  SPECIMENS:  None.  TOURNIQUET TIME:  105 minutes.  DISPOSITION:  Stable to PACU.  INDICATIONS:  Mr. Mowrey is a 51 year old male, who sustained an injury to his left hand 2 days ago at work.  He has had lacerations of the index, long, and ring fingers.  He was seen in emergency department, where the wounds were cleaned and sutured.  He followed up in the office.  I recommended operative exploration with repair of tendon, artery, and nerve as necessary.  Risks, benefits, and alternatives of surgery were discussed including the risk of blood  loss; infection; damage to nerves, vessels, tendons, ligaments, bone; failure of surgery; need for additional surgery; complications with wound healing; continued pain; and continued numbness.  He voiced understanding of these risks, elected to proceed.  OPERATIVE COURSE:  After being identified preoperatively by myself, the patient and I agreed upon procedure and site of procedure.  Surgical site was marked.  Risks, benefits, and alternatives of surgery were reviewed and he wished to proceed.  Surgical consent had been signed. He was given IV Ancef as preoperative antibiotic prophylaxis.  He was transferred to the operating room and placed on the operating room table in supine position with left upper extremity on arm board.  General anesthesia induced by anesthesiologist.  Left upper extremity was prepped and draped in normal sterile orthopedic fashion.  Surgical pause was performed between the surgeons, anesthesia, and operating room staff, and all were in agreement as to the patient, procedure, and site of procedure.  Tourniquet at the proximal aspect of the extremity was inflated to 250 mmHg after exsanguination of the limb with an Esmarch bandage.  The sutures were removed.  The wounds were explored.  In the ring finger, the ulnar digital nerve and  artery and radial digital nerve and artery were intact.  The flexor tendon was intact.  In the long finger, the ulnar digital artery was 100% lacerated.  The ulnar digital nerve was approximately 50% lacerated.  The digital sheath was lacerated.  The tendons within were intact.  The radial digital nerve and artery were intact.  In the index finger, the radial digital nerve and artery were intact.  There was laceration to the flexor tendon sheath.  The tendons within were intact.  There was contusion to the ulnar digital nerve and thrombosis of the ulnar digital artery.  All wounds were copiously irrigated with sterile saline by bulb  syringe. The laceration of the ring finger measuring 1-1.5 cm was repaired with a 4-0 nylon suture in a horizontal mattress fashion.  There was a distal wound in the long finger that had also been explored and tendon, artery, nerve were intact.  This was also closed with the 4-0 nylon in horizontal mattress fashion.  The microscope was brought in.  The ulnar digital artery of the long finger was cleared of clot and the ends freshened.  Nine-0 nylon suture was used in interrupted circumferential fashion to reapproximate the arterial ends.  The damaged portions of the ulnar digital nerve were trimmed.  An AxoGen nerve tube was split and used as a wrap.  This was secured with the 9-0 nylon suture.  In the index finger, the thrombosed portion of the ulnar digital artery was excised.  Clot was removed from the artery and the artery repaired with 9-0 nylon suture in an interrupted circumferential fashion.  This provided good reapposition of the soft tissue ends.  Prior to bringing in the microscope, 5-0 chromic suture was used to repair the flexor tendon sheath of the index finger.  The 5-0 chromic suture was also used to repair the flexor tendon sheath of the long finger.  The wounds were all closed with 4-0 nylon in horizontal mattress fashion.  There was some skin at risk in the index and long finger.  Good coverage over repaired structures was achieved.  A digital block was performed with 10 mL of 0.25% plain Marcaine to aid in postoperative analgesia.  The wounds were dressed with sterile Xeroform, 4x4s, and wrapped with a Kerlix bandage.  A dorsal blocking splint was placed and wrapped with Kerlix and Ace bandage.  Tourniquet was deflated at approximately 105 minutes.  Fingertips were pink with brisk capillary refill after deflation of tourniquet.  The operative drapes were broken down, the patient was awakened from anesthesia safely.  He was transferred back to stretcher and taken to  PACU in stable condition.  I will see him back in the office in 1 week for postoperative followup.  I will give him Percocet 5/325 one to two p.o. q.6 hours p.r.n. pain, dispensed #20.  He will also be continued on antibiotic.     Leanora Cover, MD     KK/MEDQ  D:  02/21/2017  T:  02/21/2017  Job:  [id number]

## 2017-02-21 NOTE — H&P (Signed)
[name] is an 51 y.o. male.   Chief Complaint: left hand lacerations HPI: 51 yo rhd male states he got left hand in machine at work two days ago sustaining lacerations to index, long, and ring fingers.  Seen in ED where wounds sutured.  Followed up in office.  He wishes to undergo operative exploration with repair of tendon/artery/nerve as necessary.  Allergies: No Known Allergies  Past Medical History:  Diagnosis Date  . Anal fissure   . Anxiety   . Asthma    "mild" per patient. inhaler prn  . Cancer ([name])    right shoulder melanoma  . COPD (chronic obstructive pulmonary disease) ([place])    "mild" per patient, no smoking history, possibly due to working in a [employer]  . Depression   . GERD (gastroesophageal reflux disease)   . H/O hiatal hernia   . History of kidney stones   . Hypertension   . Obesity (BMI 30-39.9) AUG 2013 266 LBS   FEB 2015 284 LBS    Past Surgical History:  Procedure Laterality Date  . anterior cervical disectomy    . APPENDECTOMY    . CIRCUMCISION    . COLONOSCOPY  11/21/2011   MOD IH    . DG ARTHRO THUMB*L*     removal of cyst  . FLEXIBLE SIGMOIDOSCOPY N/A 04/12/2013   HEMRRHOID BANDING X3.   . HEMORRHOID BANDING N/A 04/12/2013   Procedure: HEMORRHOID BANDING;  Surgeon: Danie Binder, MD;  Location: [facility];  Service: Endoscopy;  Laterality: N/A;    Family History: Family History  Problem Relation Age of Onset  . Liver disease Brother        ? etiology    Social History:   reports that  has never smoked. he has never used smokeless tobacco. He reports that he drinks alcohol. He reports that he does not use drugs.  Medications: Medications Prior to Admission  Medication Sig Dispense Refill  . acetaminophen (TYLENOL) 500 MG tablet Take 1,000 mg by mouth every 6 (six) hours as needed. Pain    . albuterol (PROAIR HFA) 108 (90 BASE) MCG/ACT inhaler Inhale 1 puff into the lungs every 6 (six) hours as needed for wheezing or shortness  of breath. 3.7 g 0  . ALPRAZolam (XANAX) 0.5 MG tablet Take 0.5 mg by mouth at bedtime as needed. For sleep    . aspirin EC 81 MG tablet Take 81 mg by mouth daily.    . cephALEXin (KEFLEX) 500 MG capsule Take 1 capsule (500 mg total) by mouth 4 (four) times daily. 20 capsule 0  . HYDROcodone-acetaminophen (NORCO/VICODIN) 5-325 MG tablet Take 1-2 tablets by mouth every 4 (four) hours as needed. 6 tablet 0  . losartan (COZAAR) 100 MG tablet Take 100 mg by mouth daily.    . naproxen sodium (ANAPROX) 550 MG tablet Take 550 mg by mouth daily.      Results for orders placed or performed during the hospital encounter of 02/21/17 (from the past 48 hour(s))  I-STAT, chem 8     Status: Abnormal   Collection Time: 02/21/17  1:34 PM  Result Value Ref Range   Sodium 141 135 - 145 mmol/L   Potassium 4.0 3.5 - 5.1 mmol/L   Chloride 101 101 - 111 mmol/L   BUN 10 6 - 20 mg/dL   Creatinine, Ser 0.80 0.61 - 1.24 mg/dL   Glucose, Bld 101 (H) 65 - 99 mg/dL   Calcium, Ion 1.25 1.15 - 1.40  mmol/L   TCO2 29 22 - 32 mmol/L   Hemoglobin 13.3 13.0 - 17.0 g/dL   HCT 39.0 39.0 - 52.0 %    Dg Hand Complete Left  Result Date: 02/19/2017 CLINICAL DATA:  Finger lacerations after sticking hand into fan. EXAM: LEFT HAND - COMPLETE 3+ VIEW COMPARISON:  None. FINDINGS: Lacerations are seen involving the volar soft tissues of the distal portions of the third, fourth and fifth fingers. No radiopaque foreign body is noted. There is seen at defect involving the distal tuft of the third distal phalanx with associated bone fragments which appears to represent fracture of indeterminate age. No other bony abnormality is noted. IMPRESSION: Lacerations are seen involving the volar soft tissues of the third, fourth and fifth fingers without underlying fracture. Defect is noted in the distal tuft of the third distal phalanx with adjacent bone fragments concerning for fracture of indeterminate age. Electronically Signed   By: Marijo Conception, M.D.   On: 02/19/2017 15:15     A comprehensive review of systems was negative.  Blood pressure 128/74, pulse (!) 56, temperature 98.4 F (36.9 C), temperature source Tympanic, resp. rate 18, height 6\' 2"  (1.88 m), weight 131.3 kg (289 lb 6.4 oz), SpO2 99 %.  General appearance: alert, cooperative and appears stated age Head: Normocephalic, without obvious abnormality, atraumatic Neck: supple, symmetrical, trachea midline Resp: clear to auscultation bilaterally Cardio: regular rate and rhythm GI: non-tender Extremities: Intact sensation and capillary refill all digits.  +epl/fpl/io.  Volar lacerations to index, long, ring fingers of left hand. Pulses: 2+ and symmetric Skin: Skin color, texture, turgor normal. No rashes or lesions Neurologic: Grossly normal Incision/Wound: as above  Assessment/Plan Left index, long, ring finger lacerations.  Plan exploration with repair tendon/artery/nerve as necessary.  Risks, benefits, and alternatives of surgery were discussed and the patient agrees with the plan of care.   [name] R 02/21/2017, 2:10 PM

## 2017-02-21 NOTE — Brief Op Note (Signed)
02/21/2017  4:56 PM  PATIENT:  [name]  51 y.o. male  PRE-OPERATIVE DIAGNOSIS:  Laceration Left Index, Long, and Ring Fingers  POST-OPERATIVE DIAGNOSIS:  Laceration Left Index, Long, and Ring Fingers  PROCEDURE:  Procedure(s): WOUND EXPLORATION LEFT INDEX, LONG, AND RING FINGERS; POSSIBLE NERVE, TENDON AND ARTERY REPAIR (Left)  SURGEON:  Surgeon(s) and Role:    * Leanora Cover, MD - Primary    * Daryll Brod, MD - Assisting  PHYSICIAN ASSISTANT:   ASSISTANTS: Daryll Brod, MD   ANESTHESIA:   general  EBL:  5 mL   BLOOD ADMINISTERED:none  DRAINS: none   LOCAL MEDICATIONS USED:  MARCAINE     SPECIMEN:  No Specimen  DISPOSITION OF SPECIMEN:  N/A  COUNTS:  YES  TOURNIQUET:  * Missing tourniquet times found for documented tourniquets in log: [id number] *  DICTATION: .Other Dictation: Dictation Number [id number]  PLAN OF CARE: Discharge to home after PACU  PATIENT DISPOSITION:  PACU - hemodynamically stable.

## 2017-02-21 NOTE — Anesthesia Procedure Notes (Signed)
Procedure Name: LMA Insertion Date/Time: 02/21/2017 2:49 PM Performed by: Hewitt Blade, CRNA Pre-anesthesia Checklist: Patient identified, Emergency Drugs available, Suction available and Patient being monitored Patient Re-evaluated:Patient Re-evaluated prior to induction Oxygen Delivery Method: Circle system utilized Preoxygenation: Pre-oxygenation with 100% oxygen Induction Type: IV induction LMA: LMA inserted LMA Size: 5.0 Number of attempts: 1 Placement Confirmation: positive ETCO2 and breath sounds checked- equal and bilateral Tube secured with: Tape Dental Injury: Teeth and Oropharynx as per pre-operative assessment

## 2017-02-21 NOTE — Op Note (Signed)
I assisted Surgeon(s) and Role:    * Leanora Cover, MD - Primary    Daryll Brod, MD - Assisting on the Procedure(s): WOUND EXPLORATION LEFT INDEX, LONG, AND RING FINGERS; POSSIBLE NERVE, TENDON AND ARTERY REPAIR on 02/21/2017.  I provided assistance on this case as follows: exploration and microscopic nerve and artery repairs, closure of the wounds and application of the dressings and splint.  Electronically signed by: Wynonia Sours, MD Date: 02/21/2017 Time: 4:58 PM

## 2017-02-21 NOTE — Discharge Instructions (Addendum)

## 2017-02-21 NOTE — Anesthesia Preprocedure Evaluation (Signed)
Anesthesia Evaluation  Patient identified by MRN, date of birth, ID band Patient awake    Reviewed: Allergy & Precautions, H&P , NPO status , Patient's Chart, lab work & pertinent test results  History of Anesthesia Complications (+) history of anesthetic complications (failed conscious sedation due to bradycardia.)  Airway Mallampati: I       Dental  (+) Teeth Intact   Pulmonary neg pulmonary ROS,    breath sounds clear to auscultation       Cardiovascular hypertension, Pt. on medications  Rhythm:Regular Rate:Bradycardia     Neuro/Psych PSYCHIATRIC DISORDERS Anxiety Depression    GI/Hepatic hiatal hernia,   Endo/Other    Renal/GU      Musculoskeletal   Abdominal   Peds  Hematology   Anesthesia Other Findings   Reproductive/Obstetrics                             Anesthesia Physical  Anesthesia Plan  ASA: II  Anesthesia Plan: General   Post-op Pain Management:    Induction: Intravenous  PONV Risk Score and Plan: 2 and Ondansetron and Midazolam  Airway Management Planned: LMA  Additional Equipment:   Intra-op Plan:   Post-operative Plan: Extubation in OR  Informed Consent: I have reviewed the patients History and Physical, chart, labs and discussed the procedure including the risks, benefits and alternatives for the proposed anesthesia with the patient or authorized representative who has indicated his/her understanding and acceptance.   Dental advisory given  Plan Discussed with: CRNA  Anesthesia Plan Comments:         Anesthesia Quick Evaluation

## 2017-02-21 NOTE — Op Note (Signed)
[id number] 

## 2017-02-21 NOTE — Transfer of Care (Signed)
Immediate Anesthesia Transfer of Care Note  Patient: [name]  Procedure(s) Performed: WOUND EXPLORATION LEFT INDEX, LONG, AND RING FINGERS; POSSIBLE NERVE, TENDON AND ARTERY REPAIR (Left )  Patient Location: PACU  Anesthesia Type:General  Level of Consciousness: awake and sedated  Airway & Oxygen Therapy: Patient Spontanous Breathing and Patient connected to face mask oxygen  Post-op Assessment: Report given to RN and Post -op Vital signs reviewed and stable  Post vital signs: Reviewed and stable  Last Vitals:  Vitals:   02/21/17 1255 02/21/17 1702  BP: 128/74   Pulse: (!) 56 72  Resp: 18   Temp: 36.9 C (P) 36.9 C  SpO2: 99% 97%    Last Pain:  Vitals:   02/21/17 1255  TempSrc: Tympanic  PainSc: 2       Patients Stated Pain Goal: 1 (98/33/82 5053)  Complications: No apparent anesthesia complications

## 2017-02-22 NOTE — Anesthesia Postprocedure Evaluation (Signed)
Anesthesia Post Note  Patient: [name]  Procedure(s) Performed: WOUND EXPLORATION LEFT INDEX, LONG, AND RING FINGERS; POSSIBLE NERVE, TENDON AND ARTERY REPAIR (Left )     Patient location during evaluation: PACU Anesthesia Type: General Level of consciousness: awake and alert Pain management: pain level controlled Vital Signs Assessment: post-procedure vital signs reviewed and stable Respiratory status: spontaneous breathing, nonlabored ventilation, respiratory function stable and patient connected to nasal cannula oxygen Cardiovascular status: blood pressure returned to baseline and stable Postop Assessment: no apparent nausea or vomiting Anesthetic complications: no    Last Vitals:  Vitals:   02/21/17 1730 02/21/17 1757  BP: 123/75 139/78  Pulse: 63 60  Resp: 11 16  Temp:  37 C  SpO2: 92% 99%    Last Pain:  Vitals:   02/21/17 1757  TempSrc: Oral  PainSc: [place]

## 2017-02-26 ENCOUNTER — Encounter (HOSPITAL_BASED_OUTPATIENT_CLINIC_OR_DEPARTMENT_OTHER): Payer: Self-pay | Admitting: Orthopedic Surgery

## 2017-02-27 ENCOUNTER — Encounter (HOSPITAL_BASED_OUTPATIENT_CLINIC_OR_DEPARTMENT_OTHER): Payer: Self-pay | Admitting: Orthopedic Surgery

## 2017-03-09 ENCOUNTER — Encounter (HOSPITAL_BASED_OUTPATIENT_CLINIC_OR_DEPARTMENT_OTHER): Payer: Self-pay | Admitting: Orthopedic Surgery

## 2017-03-09 NOTE — Addendum Note (Signed)
Addendum  created 03/09/17 1118 by Albertha Ghee, MD   Intraprocedure Event edited, Intraprocedure Staff edited

## 2018-06-17 ENCOUNTER — Encounter: Payer: Self-pay | Admitting: Cardiology

## 2018-06-17 ENCOUNTER — Telehealth: Payer: Self-pay | Admitting: Cardiology

## 2018-06-17 NOTE — Telephone Encounter (Signed)
Virtual Visit Pre-Appointment Phone Call  Steps For Call:  1. Confirm consent - "In the setting of the current Covid19 crisis, you are scheduled for a (phone or video) visit with your provider on (date) at (time).  Just as we do with many in-office visits, in order for you to participate in this visit, we must obtain consent.  If you'd like, I can send this to your mychart (if signed up) or email for you to review.  Otherwise, I can obtain your verbal consent now.  All virtual visits are billed to your insurance company just like a normal visit would be.  By agreeing to a virtual visit, we'd like you to understand that the technology does not allow for your provider to perform an examination, and thus may limit your provider's ability to fully assess your condition.  Finally, though the technology is pretty good, we cannot assure that it will always work on either your or our end, and in the setting of a video visit, we may have to convert it to a phone-only visit.  In either situation, we cannot ensure that we have a secure connection.  Are you willing to proceed?" STAFF: Did the patient verbally acknowledge consent to telehealth visit? Document YES/NO here: yes  2. Confirm the BEST phone number to call the day of the visit by including in appointment notes [phone]   3. Give patient instructions for WebEx/MyChart download to smartphone as below or Doximity/Doxy.me if video visit (depending on what platform provider is using)  4. Advise patient to be prepared with their blood pressure, heart rate, weight, any heart rhythm information, their current medicines, and a piece of paper and pen handy for any instructions they may receive the day of their visit  5. Inform patient they will receive a phone call 15 minutes prior to their appointment time (may be from unknown caller ID) so they should be prepared to answer  6. Confirm that appointment type is correct in Epic appointment notes (VIDEO vs  PHONE)     TELEPHONE CALL NOTE  [name] has been deemed a candidate for a follow-up tele-health visit to limit community exposure during the Covid-19 pandemic. I spoke with the patient via phone to ensure availability of phone/video source, confirm preferred email & phone number, and discuss instructions and expectations.  I reminded [name] to be prepared with any vital sign and/or heart rhythm information that could potentially be obtained via home monitoring, at the time of his visit. I reminded [name] to expect a phone call at the time of his visit if his visit.  [name] 06/17/2018 1:59 PM   INSTRUCTIONS FOR DOWNLOADING THE [facility] APP TO SMARTPHONE  - If Apple, ask patient to go to App Store and type in WebEx in the search bar. [place] [employer], the blue/green circle. If Android, go to [employer] and type in [employer] in the search bar. The app is free but as with any other app downloads, their phone may require them to verify saved payment information or Apple/Android password.  - The patient does NOT have to create an account. - On the day of the visit, the assist will walk the patient through joining the meeting with the meeting number/password.  INSTRUCTIONS FOR DOWNLOADING THE MYCHART APP TO SMARTPHONE  - The patient must first make sure to have activated MyChart and know their login information - If Apple, go to [employer] and  type in MyChart in the search bar and download the app. If Android, ask patient to go to [employer] and type in [place] in the search bar and download the app. The app is free but as with any other app downloads, their phone may require them to verify saved payment information or Apple/Android password.  - The patient will need to then log into the app with their MyChart username and password, and select [facility] as their healthcare provider to link the account. When it is time for your visit, go to  the MyChart app, find appointments, and click Begin Video Visit. Be sure to Select Allow for your device to access the Microphone and Camera for your visit. You will then be connected, and your provider will be with you shortly.  **If they have any issues connecting, or need assistance please contact MyChart service desk [phone])**  **If using a computer, in order to ensure the best quality for their visit they will need to use either of the following Internet Browsers: [employer], or Google Chrome**  IF USING DOXIMITY or DOXY.ME - The patient will receive a link just prior to their visit, either by text or email (to be determined day of appointment depending on if it's doxy.me or Doximity).     FULL LENGTH CONSENT FOR TELE-HEALTH VISIT   I hereby voluntarily request, consent and authorize [facility] and its employed or contracted physicians, physician assistants, nurse practitioners or other licensed health care professionals (the Practitioner), to provide me with telemedicine health care services (the Services") as deemed necessary by the treating Practitioner. I acknowledge and consent to receive the Services by the Practitioner via telemedicine. I understand that the telemedicine visit will involve communicating with the Practitioner through live audiovisual communication technology and the disclosure of certain medical information by electronic transmission. I acknowledge that I have been given the opportunity to request an in-person assessment or other available alternative prior to the telemedicine visit and am voluntarily participating in the telemedicine visit.  I understand that I have the right to withhold or withdraw my consent to the use of telemedicine in the course of my care at any time, without affecting my right to future care or treatment, and that the Practitioner or I may terminate the telemedicine visit at any time. I understand that I have the right to  inspect all information obtained and/or recorded in the course of the telemedicine visit and may receive copies of available information for a reasonable fee.  I understand that some of the potential risks of receiving the Services via telemedicine include:   Delay or interruption in medical evaluation due to technological equipment failure or disruption;  Information transmitted may not be sufficient (e.g. poor resolution of images) to allow for appropriate medical decision making by the Practitioner; and/or   In rare instances, security protocols could fail, causing a breach of personal health information.  Furthermore, I acknowledge that it is my responsibility to provide information about my medical history, conditions and care that is complete and accurate to the best of my ability. I acknowledge that Practitioner's advice, recommendations, and/or decision may be based on factors not within their control, such as incomplete or inaccurate data provided by me or distortions of diagnostic images or specimens that may result from electronic transmissions. I understand that the practice of medicine is not an exact science and that Practitioner makes no warranties or guarantees regarding treatment outcomes. I acknowledge that I will receive  a copy of this consent concurrently upon execution via email to the email address I last provided but may also request a printed copy by calling the office of [facility].    I understand that my insurance will be billed for this visit.   I have read or had this consent read to me.  I understand the contents of this consent, which adequately explains the benefits and risks of the Services being provided via telemedicine.   I have been provided ample opportunity to ask questions regarding this consent and the Services and have had my questions answered to my satisfaction.  I give my informed consent for the services to be provided through the use of  telemedicine in my medical care  By participating in this telemedicine visit I agree to the above.

## 2018-06-22 ENCOUNTER — Encounter: Payer: Self-pay | Admitting: Cardiology

## 2018-06-22 ENCOUNTER — Telehealth (INDEPENDENT_AMBULATORY_CARE_PROVIDER_SITE_OTHER): Payer: Self-pay | Admitting: Cardiology

## 2018-06-22 VITALS — Ht 74.0 in | Wt 291.0 lb

## 2018-06-22 DIAGNOSIS — R000 Tachycardia, unspecified: Secondary | ICD-10-CM

## 2018-06-22 DIAGNOSIS — R002 Palpitations: Secondary | ICD-10-CM

## 2018-06-22 MED ORDER — ALBUTEROL SULFATE HFA 108 (90 BASE) MCG/ACT IN AERS: 1.0000 | INHALATION_SPRAY | Freq: Four times a day (QID) | RESPIRATORY_TRACT | 1 refills | Status: DC | PRN

## 2018-06-22 NOTE — Progress Notes (Signed)
Virtual Visit via Telephone Note   This visit type was conducted due to national recommendations for restrictions regarding the COVID-19 Pandemic (e.g. social distancing) in an effort to limit this patient's exposure and mitigate transmission in our community.  Due to his co-morbid illnesses, this patient is at least at moderate risk for complications without adequate follow up.  This format is felt to be most appropriate for this patient at this time.  The patient did not have access to video technology/had technical difficulties with video requiring transitioning to audio format only (telephone).  All issues noted in this document were discussed and addressed.  No physical exam could be performed with this format.  Please refer to the patient's chart for his  consent to telehealth for [facility].   Evaluation Performed:  Follow-up visit  Date:  06/22/2018   ID:  [name], DOB 1965-04-04, MRN [id number]  Patient Location: Home Provider Location: Home  PCP:  Sharilyn Sites, MD  Cardiologist:  Dr Carlyle Dolly MD Electrophysiologist:  None   Chief Complaint:  Palpitations  History of Present Illness:    [name] is a 53 y.o. male seen today as a new patient, last seen in 2016.    1. Low heart rates  - chronic bradycardia that has been asymptomatic.  - normal chronotropic response on recent exercise stress test.  -no recent symptoms.    2. High heart rates/Palpitations - few weeks ago noticed high heart rates with walking - reports on treadmill HR up to 200 by treadmill check - felt dizzy, SOB. Some palpitations. Stopped and rested and felt better - reports while out doing yard work had episode of SOB with exertion.  - can have some palpitations - drinks frequent sodas. No coffee, rare tea, no EtOH    The patient does not have symptoms concerning for COVID-19 infection (fever, chills, cough, or new shortness of breath).    Past Medical History:   Diagnosis Date  . Anal fissure   . Anxiety   . Asthma    "mild" per patient. inhaler prn  . Cancer ([place])    right shoulder melanoma  . COPD (chronic obstructive pulmonary disease) ([place])    "mild" per patient, no smoking history, possibly due to working in a [employer]  . Depression   . GERD (gastroesophageal reflux disease)   . H/O hiatal hernia   . History of kidney stones   . Hypertension   . Obesity (BMI 30-39.9) AUG 2013 266 LBS   FEB 2015 284 LBS   Past Surgical History:  Procedure Laterality Date  . anterior cervical disectomy    . APPENDECTOMY    . CIRCUMCISION    . COLONOSCOPY  11/21/2011   MOD IH    . DG ARTHRO THUMB*L*     removal of cyst  . FLEXIBLE SIGMOIDOSCOPY N/A 04/12/2013   HEMRRHOID BANDING X3.   . HEMORRHOID BANDING N/A 04/12/2013   Procedure: HEMORRHOID BANDING;  Surgeon: Danie Binder, MD;  [facility];  Service: Endoscopy;  Laterality: N/A;  . NERVE, TENDON AND ARTERY REPAIR Left 02/21/2017   Procedure: WOUND EXPLORATION LEFT INDEX, LONG, AND RING FINGERS;  NERVE, TENDON AND ARTERY REPAIR;  Surgeon: Leanora Cover, MD;  Location: [facility];  Service: Orthopedics;  Laterality: Left;     No outpatient medications have been marked as taking for the 06/22/18 encounter (Appointment) with Arnoldo Lenis, MD.     Allergies:   Patient has  no known allergies.   Social History   Tobacco Use  . Smoking status: Never Smoker  . Smokeless tobacco: Never Used  Substance Use Topics  . Alcohol use: Yes    Alcohol/week: 0.0 standard drinks    Comment: socially  . Drug use: No     Family Hx: The patient's family history includes Liver disease in his brother.  ROS:   Please see the history of present illness.     All other systems reviewed and are negative.   Prior CV studies:   The following studies were reviewed today:  05/07/13 EKG: NSR, rate 64, non-spec ST/T changes   09/2012 EKG  NSR, rate 48, pr 154ms, no block   05/2013 Echo LVEF 60-65%, mild LVH, normal diastolic  04/2023 Stress echo Stress results: Maximal heart rate during stress was 157bpm (97% of maximal predicted heart rate). The maximal predicted heart rate was 173bpm.The target heart rate was achieved. The heart rate response to stress was normal. There was a normal resting blood pressure with an appropriate response to stress. The rate-pressure product for the peak heart rate and blood pressure was 27976mm Hg/min. The patient experienced no chest pain during stress. Functional capacity was normal.  ------------------------------------------------------------ Baseline:  - The estimated LV ejection fraction was 55%. - Hypokinesis of the distallateral and apical LV myocardium. Immediate post stress:  - LV global systolic function was vigorous. - Normal wall motion; no LV regional wall motion abnormalities.  02/2014 PFTs Mild obstruction consistent with mild asthma/COPD  Labs/Other Tests and Data Reviewed:    EKG:  na  Recent Labs: No results found for requested labs within last 8760 hours.   Recent Lipid Panel No results found for: CHOL, TRIG, HDL, CHOLHDL, LDLCALC, LDLDIRECT  Wt Readings from Last 3 Encounters:  02/21/17 289 lb 6.4 oz (131.3 kg)  02/19/17 285 lb (129.3 kg)  01/12/15 269 lb 9.6 oz (122.3 kg)     Objective:    Vital Signs:  There were no vitals taken for this visit.  Normal affect, normal speech pattern and tone. Sounds comfortable in no distress. No auditory sounds of deep breathing or wheezing.   ASSESSMENT & PLAN:   1. Tachycardia/palpitatoins - unclear is symptomatic arrhythmia. We will obtain a 2 week event monitor to further evlauate - wean caffeine - request labs from pcp - if av nodal agents ultimately used, will need careful dosing as he has had chronic bradycardia in the past   COVID-19 Education: The signs and symptoms of COVID-19 were discussed with the patient and how to seek care  for testing (follow up with PCP or arrange E-visit).  The importance of social distancing was discussed today.  Time:   Today, I have spent 20 minutes with the patient with telehealth technology discussing the above problems.     Medication Adjustments/Labs and Tests Ordered: Current medicines are reviewed at length with the patient today.  Concerns regarding medicines are outlined above.   Tests Ordered: No orders of the defined types were placed in this encounter.   Medication Changes: No orders of the defined types were placed in this encounter.   Disposition:  Follow up virtual visit 4 weeks  Signed, Carlyle Dolly, MD  06/22/2018 8:15 AM    [place]

## 2018-06-22 NOTE — Patient Instructions (Signed)
Medication Instructions: Your physician recommends that you continue on your current medications as directed. Please refer to the Current Medication list given to you today.   Labwork: None today  Procedures/Testing: Your physician has recommended that you wear an event monitor for 14 days. Event monitors are medical devices that record the heart's electrical activity. Doctors most often [place] these monitors to diagnose arrhythmias. Arrhythmias are problems with the speed or rhythm of the heartbeat. The monitor is a small, portable device. You can wear one while you do your normal daily activities. This is usually used to diagnose what is causing palpitations/syncope (passing out).    Follow-Up: 4 weeks Virtual Visit with Dr.Branch  Any Additional Special Instructions Will Be Listed Below (If Applicable).     If you need a refill on your cardiac medications before your next appointment, please call your pharmacy.

## 2018-06-26 ENCOUNTER — Ambulatory Visit (INDEPENDENT_AMBULATORY_CARE_PROVIDER_SITE_OTHER): Payer: Self-pay

## 2018-06-26 DIAGNOSIS — R002 Palpitations: Secondary | ICD-10-CM

## 2018-07-28 ENCOUNTER — Other Ambulatory Visit: Payer: Self-pay

## 2018-07-30 ENCOUNTER — Ambulatory Visit: Payer: Self-pay

## 2018-07-30 ENCOUNTER — Encounter: Payer: Self-pay | Admitting: Cardiology

## 2018-07-30 ENCOUNTER — Telehealth (INDEPENDENT_AMBULATORY_CARE_PROVIDER_SITE_OTHER): Payer: Self-pay | Admitting: Cardiology

## 2018-07-30 ENCOUNTER — Other Ambulatory Visit: Payer: Self-pay

## 2018-07-30 VITALS — BP 130/117 | HR 67 | Ht 74.0 in | Wt 297.0 lb

## 2018-07-30 DIAGNOSIS — I4891 Unspecified atrial fibrillation: Secondary | ICD-10-CM

## 2018-07-30 MED ORDER — APIXABAN 5 MG PO TABS: 5.0000 mg | ORAL_TABLET | Freq: Two times a day (BID) | ORAL | 3 refills | Status: DC

## 2018-07-30 MED ORDER — METOPROLOL TARTRATE 25 MG PO TABS: 25.0000 mg | ORAL_TABLET | Freq: Two times a day (BID) | ORAL | 3 refills | Status: DC

## 2018-07-30 NOTE — Addendum Note (Signed)
Addended by: Debbora Lacrosse R on: 07/30/2018 04:03 PM   Modules accepted: Orders

## 2018-07-30 NOTE — Progress Notes (Signed)
Medication Instructions:  STOP ASPIRIN  START ELIQUIS 5 MG, TWO TIMES DAILY  START LOPESSOR 25 MG BID   Labwork: NONE  Testing/Procedures: NONE  Follow-Up: Your physician recommends that you schedule a follow-up appointment in: [place]   Your physician recommends that you schedule a follow-up appointment in: 2 MONTHS    Any Other Special Instructions Will Be Listed Below (If Applicable). PLEASE CALL IN 1 WEEK WITH BLOOS PRESSURE LOG & HEART RATE     If you need a refill on your cardiac medications before your next appointment, please call your pharmacy.

## 2018-07-30 NOTE — Progress Notes (Signed)
Virtual Visit via Telephone Note   This visit type was conducted due to national recommendations for restrictions regarding the COVID-19 Pandemic (e.g. social distancing) in an effort to limit this patient's exposure and mitigate transmission in our community.  Due to his co-morbid illnesses, this patient is at least at moderate risk for complications without adequate follow up.  This format is felt to be most appropriate for this patient at this time.  The patient did not have access to video technology/had technical difficulties with video requiring transitioning to audio format only (telephone).  All issues noted in this document were discussed and addressed.  No physical exam could be performed with this format.  Please refer to the patient's chart for his  consent to telehealth for [facility]   Date:  07/30/2018   ID:  [name], DOB [birth year], MRN [id number]  Patient Location: Home Provider Location: Home  PCP:  Sharilyn Sites, MD  Cardiologist:  Dr Carlyle Dolly MD Electrophysiologist:  None   Evaluation Performed:  Follow-Up Visit  Chief Complaint:  3 week follow up  History of Present Illness:    [name] is a 53 y.o. male seen today for a follow up visit on recent issues with tachycardia.    1. Low heart rates  - chronic bradycardia that has been asymptomatic.  - normal chronotropic response on prior exercise stress test.     2. High heart rates/Palpitations/New diagnosis of afib - few weeks ago noticed high heart rates with walking - reports on treadmill HR up to 200 by treadmill check - felt dizzy, SOB. Some palpitations. Stopped and rested and felt better - reports while out doing yard work had episode of SOB with exertion.  - can have some palpitations - drinks frequent sodas. No coffee, rare tea, no EtOH   - 07/2018 2 week event monitor showed coarse afib vs aflutter with several RVR episodes, new diagnosis for patient.   - continues to  note elevated heart rates at home. Some DOE with acitivities.      3. OSA screen - +snoring, +apnea, + daytime somonlence   The patient does not have symptoms concerning for COVID-19 infection (fever, chills, cough, or new shortness of breath).    Past Medical History:  Diagnosis Date  . Anal fissure   . Anxiety   . Asthma    "mild" per patient. inhaler prn  . Cancer ([place])    right shoulder melanoma  . COPD (chronic obstructive pulmonary disease) ([place])    "mild" per patient, no smoking history, possibly due to working in a [employer]  . Depression   . GERD (gastroesophageal reflux disease)   . H/O hiatal hernia   . History of kidney stones   . Hypertension   . Obesity (BMI 30-39.9) AUG 2013 266 LBS   FEB 2015 284 LBS   Past Surgical History:  Procedure Laterality Date  . anterior cervical disectomy    . APPENDECTOMY    . CIRCUMCISION    . COLONOSCOPY  11/21/2011   MOD IH    . DG ARTHRO THUMB*L*     removal of cyst  . FLEXIBLE SIGMOIDOSCOPY N/A 04/12/2013   HEMRRHOID BANDING X3.   . HEMORRHOID BANDING N/A 04/12/2013   Procedure: HEMORRHOID BANDING;  Surgeon: Danie Binder, MD;  [facility];  Service: Endoscopy;  Laterality: N/A;  . NERVE, TENDON AND ARTERY REPAIR Left 02/21/2017   Procedure: WOUND EXPLORATION LEFT INDEX, LONG, AND RING FINGERS;  NERVE, TENDON AND ARTERY REPAIR;  Surgeon: Leanora Cover, MD;  Location: [facility];  Service: Orthopedics;  Laterality: Left;     No outpatient medications have been marked as taking for the 07/30/18 encounter (Appointment) with Arnoldo Lenis, MD.     Allergies:   Patient has no known allergies.   Social History   Tobacco Use  . Smoking status: Never Smoker  . Smokeless tobacco: Never Used  Substance Use Topics  . Alcohol use: Yes    Alcohol/week: 0.0 standard drinks    Comment: socially  . Drug use: No     Family Hx: The patient's family history includes Liver disease in his brother.   ROS:   Please see the history of present illness.     All other systems reviewed and are negative.   Prior CV studies:   The following studies were reviewed today:  05/07/13 EKG: NSR, rate 64, non-spec ST/T changes   09/2012 EKG  NSR, rate 48, pr 169ms, no block  05/2013 Echo LVEF 60-65%, mild LVH, normal diastolic  06/2593 Stress echo Stress results: Maximal heart rate during stress was 157bpm (97% of maximal predicted heart rate). The maximal predicted heart rate was 173bpm.The target heart rate was achieved. The heart rate response to stress was normal. There was a normal resting blood pressure with an appropriate response to stress. The rate-pressure product for the peak heart rate and blood pressure was 27978mm Hg/min. The patient experienced no chest pain during stress. Functional capacity was normal.  ------------------------------------------------------------ Baseline:  - The estimated LV ejection fraction was 55%. - Hypokinesis of the distallateral and apical LV myocardium. Immediate post stress:  - LV global systolic function was vigorous. - Normal wall motion; no LV regional wall motion abnormalities.  02/2014 PFTs Mild obstruction consistent with mild asthma/COPD  Labs/Other Tests and Data Reviewed:    EKG:  No ECG reviewed.  Recent Labs: No results found for requested labs within last 8760 hours.   Recent Lipid Panel No results found for: CHOL, TRIG, HDL, CHOLHDL, LDLCALC, LDLDIRECT  Wt Readings from Last 3 Encounters:  06/22/18 291 lb (132 kg)  02/21/17 289 lb 6.4 oz (131.3 kg)  02/19/17 285 lb (129.3 kg)     Objective:    Vital Signs:   Today's Vitals   07/30/18 1024  BP: (!) 130/117  Pulse: 67  Weight: 297 lb (134.7 kg)  Height: 6\' 2"  (1.88 m)   Body mass index is 38.13 kg/m.  Normal affect. Normal speech pattern and tone. Comfortable, no apparent distress. No audible signs of SOB or wheezing.   ASSESSMENT & PLAN:    1.  Afib - new diagnosis based on 07/2018 monitor, in afib whole time with frequent AVR to 170s - start lopressor 25mg  bid. He will call with bp and heart log in 1 week - CHADS2Vasc score is 1 (HTN). Potential need for cardioversion, will start eliquis 5mg  bid, will recheck rhyhtm in 3 weeks for possible DCCV. If cardioverted, would need 4 more weeks of eliquis and could potentially stop after - his management may be more complicated as he has history of chronic bradycardia when in sinus rhythm.  -check echo once rates better controlled   2. OSA screen -signs and symptoms of OSA, refer to Dr Luan Pulling.    COVID-19 Education: The signs and symptoms of COVID-19 were discussed with the patient and how to seek care for testing (follow up with PCP or arrange E-visit).  The importance of  social distancing was discussed today.  Time:   Today, I have spent 12 minutes with the patient with telehealth technology discussing the above problems.     Medication Adjustments/Labs and Tests Ordered: Current medicines are reviewed at length with the patient today.  Concerns regarding medicines are outlined above.   Tests Ordered: No orders of the defined types were placed in this encounter.   Medication Changes: No orders of the defined types were placed in this encounter.   Disposition:  Follow up 2 months. Nursing visit in 3 weeks for EKG and vitals, if still in afib like plan for DCCV.   Merrily Pew, MD  07/30/2018 9:05 AM    [place]

## 2018-09-14 ENCOUNTER — Telehealth: Payer: Self-pay | Admitting: Cardiology

## 2018-09-14 MED ORDER — APIXABAN 5 MG PO TABS: 5.0000 mg | ORAL_TABLET | Freq: Two times a day (BID) | ORAL | 0 refills | Status: DC

## 2018-09-14 NOTE — Telephone Encounter (Signed)
Patient informed that samples of eliquis will be provided along with number to contact for patient assistance. Verbalized understanding. Patient says it costs him $400/mth and he does not have insurance.

## 2018-09-14 NOTE — Telephone Encounter (Signed)
Patient cant afford medication that was prescribed for blood thinner. Asking for name of medication

## 2018-10-09 ENCOUNTER — Ambulatory Visit: Payer: Self-pay | Admitting: Cardiology

## 2018-10-09 NOTE — Progress Notes (Deleted)
Clinical Summary Mr. Ackerley is a 53 y.o.male   seen today for a follow up visit on recent issues with tachycardia.    1. Low heart rates  -chronic bradycardia that has been asymptomatic. - normal chronotropic response on prior exercise stress test.     2. High heart rates/Palpitations/New diagnosis of afib - few weeks ago noticed high heart rates with walking - reports on treadmill HR up to 200 by treadmill check - felt dizzy, SOB. Some palpitations. Stopped and rested and felt better - reports while out doing yard work had episode of SOB with exertion.  - can have some palpitations - drinks frequent sodas. No coffee, rare tea, no EtOH   - 07/2018 2 week event monitor showed coarse afib vs aflutter with several RVR episodes, new diagnosis for patient.   - last visit we started lopressor 25mg  bid, started eliquis 5mg  bid - working on patient assistance for eliquis.      3. OSA screen - +snoring, +apnea, + daytime somonlence Past Medical History:  Diagnosis Date  . Anal fissure   . Anxiety   . Asthma    "mild" per patient. inhaler prn  . Cancer ([place])    right shoulder melanoma  . COPD (chronic obstructive pulmonary disease) ([place])    "mild" per patient, no smoking history, possibly due to working in a [employer]  . Depression   . GERD (gastroesophageal reflux disease)   . H/O hiatal hernia   . History of kidney stones   . Hypertension   . Obesity (BMI 30-39.9) AUG 2013 266 LBS   FEB 2015 284 LBS     No Known Allergies   Current Outpatient Medications  Medication Sig Dispense Refill  . acetaminophen (TYLENOL) 500 MG tablet Take 1,000 mg by mouth every 6 (six) hours as needed. Pain    . albuterol (PROAIR HFA) 108 (90 Base) MCG/ACT inhaler Inhale 1 puff into the lungs every 6 (six) hours as needed for wheezing or shortness of breath. 3.7 g 1  . ALPRAZolam (XANAX) 0.5 MG tablet Take 0.5 mg by mouth at bedtime as needed. For sleep    .  apixaban (ELIQUIS) 5 MG TABS tablet Take 1 tablet (5 mg total) by mouth 2 (two) times daily. 56 tablet 0  . HYDROcodone-acetaminophen (NORCO/VICODIN) 5-325 MG tablet Take 1-2 tablets by mouth every 4 (four) hours as needed. 6 tablet 0  . losartan (COZAAR) 100 MG tablet Take 100 mg by mouth daily.    . metoprolol tartrate (LOPRESSOR) 25 MG tablet Take 1 tablet (25 mg total) by mouth 2 (two) times daily. 180 tablet 3  . naproxen sodium (ANAPROX) 550 MG tablet Take 550 mg by mouth daily.    Marland Kitchen oxyCODONE-acetaminophen (PERCOCET) 5-325 MG tablet 1-2 tabs po q6 hours prn pain 20 tablet 0  . sulfamethoxazole-trimethoprim (BACTRIM DS) 800-160 MG tablet Take 1 tablet by mouth 2 (two) times daily. 14 tablet 0   No current facility-administered medications for this visit.      Past Surgical History:  Procedure Laterality Date  . anterior cervical disectomy    . APPENDECTOMY    . CIRCUMCISION    . COLONOSCOPY  11/21/2011   MOD IH    . DG ARTHRO THUMB*L*     removal of cyst  . FLEXIBLE SIGMOIDOSCOPY N/A 04/12/2013   HEMRRHOID BANDING X3.   . HEMORRHOID BANDING N/A 04/12/2013   Procedure: HEMORRHOID BANDING;  Surgeon: Danie Binder, MD;  [facility];  Service: Endoscopy;  Laterality: N/A;  . NERVE, TENDON AND ARTERY REPAIR Left 02/21/2017   Procedure: WOUND EXPLORATION LEFT INDEX, LONG, AND RING FINGERS;  NERVE, TENDON AND ARTERY REPAIR;  Surgeon: Leanora Cover, MD;  Location: [place];  Service: Orthopedics;  Laterality: Left;     No Known Allergies    Family History  Problem Relation Age of Onset  . Liver disease Brother        ? etiology     Social History Mr. Neto reports that he has never smoked. He has never used smokeless tobacco. Mr. Oleksy reports current alcohol use.   Review of Systems CONSTITUTIONAL: No weight loss, fever, chills, weakness or fatigue.  HEENT: Eyes: No visual loss, blurred vision, double vision or yellow sclerae.No hearing loss,  sneezing, congestion, runny nose or sore throat.  SKIN: No rash or itching.  CARDIOVASCULAR:  RESPIRATORY: No shortness of breath, cough or sputum.  GASTROINTESTINAL: No anorexia, nausea, vomiting or diarrhea. No abdominal pain or blood.  GENITOURINARY: No burning on urination, no polyuria NEUROLOGICAL: No headache, dizziness, syncope, paralysis, ataxia, numbness or tingling in the extremities. No change in bowel or bladder control.  MUSCULOSKELETAL: No muscle, back pain, joint pain or stiffness.  LYMPHATICS: No enlarged nodes. No history of splenectomy.  PSYCHIATRIC: No history of depression or anxiety.  ENDOCRINOLOGIC: No reports of sweating, cold or heat intolerance. No polyuria or polydipsia.  Marland Kitchen   Physical Examination There were no vitals filed for this visit. There were no vitals filed for this visit.  Gen: resting comfortably, no acute distress HEENT: no scleral icterus, pupils equal round and reactive, no palptable cervical adenopathy,  CV Resp: Clear to auscultation bilaterally GI: abdomen is soft, non-tender, non-distended, normal bowel sounds, no hepatosplenomegaly MSK: extremities are warm, no edema.  Skin: warm, no rash Neuro:  no focal deficits Psych: appropriate affect   Diagnostic Studies 05/07/13 EKG: NSR, rate 64, non-spec ST/T changes   09/2012 EKG  NSR, rate 48, pr 149ms, no block  05/2013 Echo LVEF 60-65%, mild LVH, normal diastolic  10/6379 Stress echo Stress results: Maximal heart rate during stress was 157bpm (97% of maximal predicted heart rate). The maximal predicted heart rate was 173bpm.The target heart rate was achieved. The heart rate response to stress was normal. There was a normal resting blood pressure with an appropriate response to stress. The rate-pressure product for the peak heart rate and blood pressure was 27947mm Hg/min. The patient experienced no chest pain during stress. Functional capacity was normal.   ------------------------------------------------------------ Baseline:  - The estimated LV ejection fraction was 55%. - Hypokinesis of the distallateral and apical LV myocardium. Immediate post stress:  - LV global systolic function was vigorous. - Normal wall motion; no LV regional wall motion abnormalities.  02/2014 PFTs Mild obstruction consistent with mild asthma/COPD    Assessment and Plan  1.Afib - new diagnosis based on 07/2018 monitor, in afib whole time with frequent AVR to 170s - start lopressor 25mg  bid. He will call with bp and heart log in 1 week - CHADS2Vasc score is 1 (HTN). Potential need for cardioversion, will start eliquis 5mg  bid, will recheck rhyhtm in 3 weeks for possible DCCV. If cardioverted, would need 4 more weeks of eliquis and could potentially stop after - his management may be more complicated as he has history of chronic bradycardia when in sinus rhythm.  -check echo once rates better controlled   2. OSA screen -signs and symptoms of OSA, refer  to Dr Luan Pulling.       Arnoldo Lenis, M.D., F.A.C.C.

## 2018-10-28 ENCOUNTER — Telehealth: Payer: Self-pay | Admitting: *Deleted

## 2018-10-28 MED ORDER — APIXABAN 5 MG PO TABS: 5.0000 mg | ORAL_TABLET | Freq: Two times a day (BID) | ORAL | 0 refills | Status: DC

## 2018-10-28 NOTE — Telephone Encounter (Signed)
Pt missed last appt 8/4 and rescheduled for 11/4 requesting samples of Eliquis and will also include application for pt assistance (pt has no insurance) will call us when he is at the office and will bring samples out to him

## 2018-12-02 ENCOUNTER — Telehealth: Payer: Self-pay | Admitting: Cardiology

## 2018-12-02 MED ORDER — APIXABAN 5 MG PO TABS: 5.0000 mg | ORAL_TABLET | Freq: Two times a day (BID) | ORAL | 1 refills | Status: DC

## 2018-12-02 NOTE — Telephone Encounter (Signed)
Checking on status of his paperwork for medication assitance

## 2018-12-02 NOTE — Telephone Encounter (Signed)
Verified pt income for the application and will submit today - pt aware that he would receive notification on whether he was approved

## 2018-12-08 ENCOUNTER — Telehealth: Payer: Self-pay | Admitting: Cardiology

## 2018-12-08 MED ORDER — APIXABAN 5 MG PO TABS: 5.0000 mg | ORAL_TABLET | Freq: Two times a day (BID) | ORAL | 0 refills | Status: DC

## 2018-12-08 NOTE — Telephone Encounter (Signed)
Samples available and patient aware. Will send to [facility] to follow up on patient assistance

## 2018-12-08 NOTE — Telephone Encounter (Signed)
Patient called in regards to status of application for assistance on his medications.  Patient states that he is down to one box of Eliquis.

## 2019-01-06 ENCOUNTER — Ambulatory Visit (INDEPENDENT_AMBULATORY_CARE_PROVIDER_SITE_OTHER): Payer: Self-pay | Admitting: *Deleted

## 2019-01-06 ENCOUNTER — Other Ambulatory Visit: Payer: Self-pay

## 2019-01-06 ENCOUNTER — Telehealth (INDEPENDENT_AMBULATORY_CARE_PROVIDER_SITE_OTHER): Payer: Self-pay | Admitting: Cardiology

## 2019-01-06 ENCOUNTER — Encounter: Payer: Self-pay | Admitting: Cardiology

## 2019-01-06 VITALS — BP 127/89 | HR 76 | Ht 74.0 in | Wt 299.0 lb

## 2019-01-06 VITALS — BP 127/89 | HR 76 | Ht 74.0 in | Wt 299.6 lb

## 2019-01-06 DIAGNOSIS — I4891 Unspecified atrial fibrillation: Secondary | ICD-10-CM

## 2019-01-06 DIAGNOSIS — R0602 Shortness of breath: Secondary | ICD-10-CM

## 2019-01-06 MED ORDER — METOPROLOL TARTRATE 50 MG PO TABS: 50.0000 mg | ORAL_TABLET | Freq: Two times a day (BID) | ORAL | 1 refills | Status: DC

## 2019-01-06 NOTE — Patient Instructions (Signed)
Your physician recommends that you schedule a follow-up appointment in: [place] has recommended you make the following change in your medication:   INCREASE LOPRESSOR 50 MG TWICE DAILY   1 [place] EKG/VITALS  WE WILL BE MAILING YOU [facility] PATIENT ASSISTANCE INFORMATION   Thank you for choosing [facility]!!

## 2019-01-06 NOTE — Progress Notes (Signed)
Virtual Visit via Telephone Note   This visit type was conducted due to national recommendations for restrictions regarding the COVID-19 Pandemic (e.g. social distancing) in an effort to limit this patient's exposure and mitigate transmission in our community.  Due to his co-morbid illnesses, this patient is at least at moderate risk for complications without adequate follow up.  This format is felt to be most appropriate for this patient at this time.  The patient did not have access to video technology/had technical difficulties with video requiring transitioning to audio format only (telephone).  All issues noted in this document were discussed and addressed.  No physical exam could be performed with this format.  Please refer to the patient's chart for his  consent to telehealth for [facility].   Date:  01/06/2019   ID:  [name], DOB Aug 27, 1965, MRN [id number]  Patient Location: Home Provider Location: Office  PCP:  Sharilyn Sites, MD  Cardiologist:  Carlyle Dolly, MD  Electrophysiologist:  None   Evaluation Performed:  Follow-Up Visit  Chief Complaint:  Follow up  History of Present Illness:    [name] is a 53 y.o. male seen today for a follow up visit on recent issues with tachycardia.    1. Low heart rates  -chronic bradycardia that has been asymptomatic. - normal chronotropic response on prior exercise stress test.     2. Afib - 07/2018 2 week event monitor showed coarse afib vs aflutter with several RVR episodes, new diagnosis for patient at that time   - he did not follow up. We had planned to recheck EKG in a few weeks after our last visit and plan for DCCV if he was still in afib - has palpitations at times. Can have some DOE with activities, fatigue    3. OSA screen - +snoring, +apnea, + daytime somonlence - has not seen Dr Luan Pulling since our referal, has not had insurance.   The patient does not have symptoms concerning for  COVID-19 infection (fever, chills, cough, or new shortness of breath).    Past Medical History:  Diagnosis Date  . Anal fissure   . Anxiety   . Asthma    "mild" per patient. inhaler prn  . Cancer ([place])    right shoulder melanoma  . COPD (chronic obstructive pulmonary disease) ([place])    "mild" per patient, no smoking history, possibly due to working in a [employer]  . Depression   . GERD (gastroesophageal reflux disease)   . H/O hiatal hernia   . History of kidney stones   . Hypertension   . Obesity (BMI 30-39.9) AUG 2013 266 LBS   FEB 2015 284 LBS   Past Surgical History:  Procedure Laterality Date  . anterior cervical disectomy    . APPENDECTOMY    . CIRCUMCISION    . COLONOSCOPY  11/21/2011   MOD IH    . DG ARTHRO THUMB*L*     removal of cyst  . FLEXIBLE SIGMOIDOSCOPY N/A 04/12/2013   HEMRRHOID BANDING X3.   . HEMORRHOID BANDING N/A 04/12/2013   Procedure: HEMORRHOID BANDING;  Surgeon: Danie Binder, MD;  [facility];  Service: Endoscopy;  Laterality: N/A;  . NERVE, TENDON AND ARTERY REPAIR Left 02/21/2017   Procedure: WOUND EXPLORATION LEFT INDEX, LONG, AND RING FINGERS;  NERVE, TENDON AND ARTERY REPAIR;  Surgeon: Leanora Cover, MD;  Location: [place];  Service: Orthopedics;  Laterality: Left;     No outpatient medications  have been marked as taking for the 01/06/19 encounter (Appointment) with Arnoldo Lenis, MD.     Allergies:   Patient has no known allergies.   Social History   Tobacco Use  . Smoking status: Never Smoker  . Smokeless tobacco: Never Used  Substance Use Topics  . Alcohol use: Yes    Alcohol/week: 0.0 standard drinks    Comment: socially  . Drug use: No     Family Hx: The patient's family history includes Liver disease in his brother.  ROS:   Please see the history of present illness.     All other systems reviewed and are negative.   Prior CV studies:   The following studies were reviewed today:   05/07/13 EKG: NSR, rate 64, non-spec ST/T changes   09/2012 EKG  NSR, rate 48, pr 180ms, no block  05/2013 Echo LVEF 60-65%, mild LVH, normal diastolic  0000000 Stress echo Stress results: Maximal heart rate during stress was 157bpm (97% of maximal predicted heart rate). The maximal predicted heart rate was 173bpm.The target heart rate was achieved. The heart rate response to stress was normal. There was a normal resting blood pressure with an appropriate response to stress. The rate-pressure product for the peak heart rate and blood pressure was 27926mm Hg/min. The patient experienced no chest pain during stress. Functional capacity was normal.  ------------------------------------------------------------ Baseline:  - The estimated LV ejection fraction was 55%. - Hypokinesis of the distallateral and apical LV myocardium. Immediate post stress:  - LV global systolic function was vigorous. - Normal wall motion; no LV regional wall motion abnormalities.  02/2014 PFTs Mild obstruction consistent with mild asthma/COPD  Labs/Other Tests and Data Reviewed:    EKG:  No ECG reviewed.  Recent Labs: No results found for requested labs within last 8760 hours.   Recent Lipid Panel No results found for: CHOL, TRIG, HDL, CHOLHDL, LDLCALC, LDLDIRECT  Wt Readings from Last 3 Encounters:  01/06/19 299 lb 9.6 oz (135.9 kg)  07/30/18 297 lb (134.7 kg)  06/22/18 291 lb (132 kg)     Objective:    Vital Signs:   Today's Vitals   01/06/19 1048  BP: 127/89  Pulse: 76  SpO2: 98%  Weight: 299 lb (135.6 kg)  Height: 6\' 2"  (1.88 m)   Body mass index is 38.39 kg/m.  Normal affect. Normal speech pattern and tone. Comfortable, no apparent distress. No audible signs of SOB or wheezing.   ASSESSMENT & PLAN:    1.Afib -new diagnosis within the last few months. He did not follow up as was planned, we were going to repeat an ekg and pursue DCCV if he remained in afib  - ekg today  shows afib with elevated rates. He remains symptoms. In sinus rhythm his heart rates typically are in the 50s, may lead to issues with afib management - increase lopressor to 50mg  bid, continue eliquis. CHADS2Vasc score is 1 based on HTN (awaiting echo however), has been on anticoag in anticipation of possible DCCV - nursing visit in 1 week to recheck EKG and heart rates - will give info to apply for [facility] assistance program, if approved would plan for a DCCV and echo in the future, as well as a sleep study      2. OSA screen -signs and symptoms of OSA - pending insurance or assistance status consider sleep study in the near future.    COVID-19 Education: The signs and symptoms of COVID-19 were discussed with the patient and how  to seek care for testing (follow up with PCP or arrange E-visit).  The importance of social distancing was discussed today.  Time:   Today, I have spent 22 minutes with the patient with telehealth technology discussing the above problems.     Medication Adjustments/Labs and Tests Ordered: Current medicines are reviewed at length with the patient today.  Concerns regarding medicines are outlined above.   Tests Ordered: No orders of the defined types were placed in this encounter.   Medication Changes: No orders of the defined types were placed in this encounter.   Follow Up:  Virtual Visit  in 1 month(s)  Signed, Carlyle Dolly, MD  01/06/2019 9:07 AM    [place]

## 2019-01-06 NOTE — Progress Notes (Signed)
Presents to office per phone conversation yesterday in which he reported having problems with a-fib. Medications reviewed. Reports for the past 2-3 months having some chest discomfort, fast heart rate, sob. Will discuss with Dr. Harl Bowie today during his virtual visit.

## 2019-01-13 ENCOUNTER — Other Ambulatory Visit: Payer: Self-pay

## 2019-01-13 ENCOUNTER — Ambulatory Visit (INDEPENDENT_AMBULATORY_CARE_PROVIDER_SITE_OTHER): Payer: Self-pay | Admitting: *Deleted

## 2019-01-13 DIAGNOSIS — I4891 Unspecified atrial fibrillation: Secondary | ICD-10-CM

## 2019-01-13 NOTE — Progress Notes (Signed)
Pt here for EKG/vitals - c/o SOB last night that woke him up lasting less than a minute - also c/o heart "fluttering" since last night also c/o joint pain in arms/legs - pt has been compliant on recent increase of metoprolol 50mg  bid - BP by nurse manual check 102/80 - EKG done and sent to provider

## 2019-01-13 NOTE — Progress Notes (Signed)
Pt says he will check his mail (application mailed on [id number]) and let us know when he has submitted application

## 2019-01-13 NOTE — Progress Notes (Signed)
Remaisn in afib, was he able to get the info to apply for [facility] assistance, has he started the process. Ideally we would like to cardiovert him   Zandra Abts MD

## 2019-02-15 ENCOUNTER — Encounter: Payer: Self-pay | Admitting: *Deleted

## 2019-02-15 ENCOUNTER — Encounter: Payer: Self-pay | Admitting: Cardiology

## 2019-02-15 ENCOUNTER — Ambulatory Visit (INDEPENDENT_AMBULATORY_CARE_PROVIDER_SITE_OTHER): Payer: Self-pay | Admitting: Cardiology

## 2019-02-15 ENCOUNTER — Other Ambulatory Visit: Payer: Self-pay

## 2019-02-15 VITALS — BP 115/72 | HR 62 | Ht 74.0 in | Wt 303.6 lb

## 2019-02-15 DIAGNOSIS — I4891 Unspecified atrial fibrillation: Secondary | ICD-10-CM

## 2019-02-15 MED ORDER — METOPROLOL TARTRATE 50 MG PO TABS: 75.0000 mg | ORAL_TABLET | Freq: Two times a day (BID) | ORAL | 1 refills | Status: DC

## 2019-02-15 NOTE — Patient Instructions (Signed)
Your physician recommends that you schedule a follow-up appointment in: [place] has recommended you make the following change in your medication:   INCREASE LOPRESSOR 75 MG (1 AND 1/2 TABLETS) TWICE DAILY   Thank you for choosing [facility]!!

## 2019-02-15 NOTE — Progress Notes (Signed)
Clinical Summary [name] is a 53 y.o.male seen today for a follow up visit on recent issues with tachycardia.   1. Low heart rates  -chronic bradycardia when in SR that has been asymptomatic. - normal chronotropic response onpriorexercise stress test.     2. Afib - 07/2018 2 week eventmonitor showed coarse afib vs aflutter with several RVR episodes, new diagnosis for patient at that time   - he did not follow up. We had planned to recheck EKG in a few weeks after our last visit and plan for DCCV if he was still in afib - has palpitations at times. Can have some DOE with activities, fatigue  - still working on [employer].  - starting a full time job, 90 day waiting period for insurance.   3. OSA screen - +snoring, +apnea, + daytime somonlence - has not seen Dr Luan Pulling since our referal, has not had insurance.     Past Medical History:  Diagnosis Date  . Anal fissure   . Anxiety   . Asthma    "mild" per patient. inhaler prn  . Cancer ([place])    right shoulder melanoma  . COPD (chronic obstructive pulmonary disease) ([place])    "mild" per patient, no smoking history, possibly due to working in a [employer]  . Depression   . GERD (gastroesophageal reflux disease)   . H/O hiatal hernia   . History of kidney stones   . Hypertension   . Obesity (BMI 30-39.9) AUG 2013 266 LBS   FEB 2015 284 LBS     No Known Allergies   Current Outpatient Medications  Medication Sig Dispense Refill  . acetaminophen (TYLENOL) 500 MG tablet Take 1,000 mg by mouth every 6 (six) hours as needed. Pain    . albuterol (PROAIR HFA) 108 (90 Base) MCG/ACT inhaler Inhale 1 puff into the lungs every 6 (six) hours as needed for wheezing or shortness of breath. 3.7 g 1  . ALPRAZolam (XANAX) 0.5 MG tablet Take 0.5 mg by mouth at bedtime as needed. For sleep    . apixaban (ELIQUIS) 5 MG TABS tablet Take 1 tablet (5 mg total) by mouth 2 (two) times daily. 56 tablet 0   . escitalopram (LEXAPRO) 20 MG tablet Take 20 mg by mouth daily.    Marland Kitchen losartan (COZAAR) 100 MG tablet Take 100 mg by mouth daily.    . metoprolol tartrate (LOPRESSOR) 50 MG tablet Take 1 tablet (50 mg total) by mouth 2 (two) times daily. 180 tablet 1   No current facility-administered medications for this visit.     Past Surgical History:  Procedure Laterality Date  . anterior cervical disectomy    . APPENDECTOMY    . CIRCUMCISION    . COLONOSCOPY  11/21/2011   MOD IH    . DG ARTHRO THUMB*L*     removal of cyst  . FLEXIBLE SIGMOIDOSCOPY N/A 04/12/2013   HEMRRHOID BANDING X3.   . HEMORRHOID BANDING N/A 04/12/2013   Procedure: HEMORRHOID BANDING;  Surgeon: Danie Binder, MD;  [facility];  Service: Endoscopy;  Laterality: N/A;  . NERVE, TENDON AND ARTERY REPAIR Left 02/21/2017   Procedure: WOUND EXPLORATION LEFT INDEX, LONG, AND RING FINGERS;  NERVE, TENDON AND ARTERY REPAIR;  Surgeon: Leanora Cover, MD;  Location: [place];  Service: Orthopedics;  Laterality: Left;     No Known Allergies    Family History  Problem Relation Age of Onset  . Liver  disease Brother        ? etiology     Social History Mr. Teran reports that he has never smoked. He has never used smokeless tobacco. Mr. Hairr reports current alcohol use.   Review of Systems CONSTITUTIONAL: No weight loss, fever, chills, weakness or fatigue.  HEENT: Eyes: No visual loss, blurred vision, double vision or yellow sclerae.No hearing loss, sneezing, congestion, runny nose or sore throat.  SKIN: No rash or itching.  CARDIOVASCULAR: per hpi RESPIRATORY: No shortness of breath, cough or sputum.  GASTROINTESTINAL: No anorexia, nausea, vomiting or diarrhea. No abdominal pain or blood.  GENITOURINARY: No burning on urination, no polyuria NEUROLOGICAL: No headache, dizziness, syncope, paralysis, ataxia, numbness or tingling in the extremities. No change in bowel or bladder control.    MUSCULOSKELETAL: No muscle, back pain, joint pain or stiffness.  LYMPHATICS: No enlarged nodes. No history of splenectomy.  PSYCHIATRIC: No history of depression or anxiety.  ENDOCRINOLOGIC: No reports of sweating, cold or heat intolerance. No polyuria or polydipsia.  Marland Kitchen   Physical Examination Today's Vitals   02/15/19 1341  BP: 115/72  Pulse: 62  SpO2: 96%  Weight: (!) 303 lb 9.6 oz (137.7 kg)  Height: 6\' 2"  (1.88 m)   Body mass index is 38.98 kg/m.  Gen: resting comfortably, no acute distress HEENT: no scleral icterus, pupils equal round and reactive, no palptable cervical adenopathy,  CV: irreg, no m/r/g, no jvd Resp: Clear to auscultation bilaterally GI: abdomen is soft, non-tender, non-distended, normal bowel sounds, no hepatosplenomegaly MSK: extremities are warm, no edema.  Skin: warm, no rash Neuro:  no focal deficits Psych: appropriate affect   Diagnostic Studies  05/2013 Echo LVEF 60-65%, mild LVH, normal diastolic  0000000 Stress echo Stress results: Maximal heart rate during stress was 157bpm (97% of maximal predicted heart rate). The maximal predicted heart rate was 173bpm.The target heart rate was achieved. The heart rate response to stress was normal. There was a normal resting blood pressure with an appropriate response to stress. The rate-pressure product for the peak heart rate and blood pressure was 279109mm Hg/min. The patient experienced no chest pain during stress. Functional capacity was normal.  ------------------------------------------------------------ Baseline:  - The estimated LV ejection fraction was 55%. - Hypokinesis of the distallateral and apical LV myocardium. Immediate post stress:  - LV global systolic function was vigorous. - Normal wall motion; no LV regional wall motion abnormalities.  02/2014 PFTs Mild obstruction consistent with mild asthma/COPD   Assessment and Plan   1.PersistentAfib -dynamap heart rates do  not match heart rates by EKG, today he is afib in high 90s - remains symptomatic, increase lopressor to 75mg  bid. Careful titration as he tends to have low heart rates when in SR - would plan for DCCV once [facility] assistance or his job insurance becomes active - continue eliquis for now with potential pending cardioversion, his CHADS2Vasc score is 1 so may be able to stop in the future.  - obtain echo once insurance is active     2. OSA screen -signs and symptoms of OSA - obtain sleep study when insurance is active   F/u 3 months     Arnoldo Lenis, M.D.

## 2019-06-04 ENCOUNTER — Ambulatory Visit: Payer: 59 | Admitting: Cardiology

## 2019-06-04 ENCOUNTER — Encounter: Payer: Self-pay | Admitting: Cardiology

## 2019-06-04 NOTE — Progress Notes (Deleted)
Clinical Summary [name] is a 54 y.o.male seen today for a follow up visit on recent issues with tachycardia.   1. Low heart rates  -chronic bradycardia when in SR that has been asymptomatic. - normal chronotropic response onpriorexercise stress test.     2.Afib - 07/2018 2 week eventmonitor showed coarse afib vs aflutter with several RVR episodes, new diagnosis for patientat that time   - he did not follow up. We had planned to recheck EKG in a few weeks after our last visit and plan for DCCV if he was still in afib - has palpitations at times. Can have some DOE with activities, fatigue  - still working on [employer].  - starting a full time job, 90 day waiting period for insurance.   3. OSA screen - +snoring, +apnea, + daytime somonlence - has not seen Dr Luan Pulling since our referal, has not had insurance   Past Medical History:  Diagnosis Date  . Anal fissure   . Anxiety   . Asthma    "mild" per patient. inhaler prn  . Cancer ([place])    right shoulder melanoma  . COPD (chronic obstructive pulmonary disease) ([place])    "mild" per patient, no smoking history, possibly due to working in a [employer]  . Depression   . GERD (gastroesophageal reflux disease)   . H/O hiatal hernia   . History of kidney stones   . Hypertension   . Obesity (BMI 30-39.9) AUG 2013 266 LBS   FEB 2015 284 LBS     No Known Allergies   Current Outpatient Medications  Medication Sig Dispense Refill  . acetaminophen (TYLENOL) 500 MG tablet Take 1,000 mg by mouth every 6 (six) hours as needed. Pain    . albuterol (PROAIR HFA) 108 (90 Base) MCG/ACT inhaler Inhale 1 puff into the lungs every 6 (six) hours as needed for wheezing or shortness of breath. 3.7 g 1  . ALPRAZolam (XANAX) 0.5 MG tablet Take 0.5 mg by mouth at bedtime as needed. For sleep    . apixaban (ELIQUIS) 5 MG TABS tablet Take 1 tablet (5 mg total) by mouth 2 (two) times daily. 56 tablet 0  .  escitalopram (LEXAPRO) 20 MG tablet Take 20 mg by mouth daily.    [name] losartan (COZAAR) 100 MG tablet Take 100 mg by mouth daily.    . metoprolol tartrate (LOPRESSOR) 50 MG tablet Take 1.5 tablets (75 mg total) by mouth 2 (two) times daily. 270 tablet 1   No current facility-administered medications for this visit.     Past Surgical History:  Procedure Laterality Date  . anterior cervical disectomy    . APPENDECTOMY    . CIRCUMCISION    . COLONOSCOPY  11/21/2011   MOD IH    . DG ARTHRO THUMB*L*     removal of cyst  . FLEXIBLE SIGMOIDOSCOPY N/A 04/12/2013   HEMRRHOID BANDING X3.   . HEMORRHOID BANDING N/A 04/12/2013   Procedure: HEMORRHOID BANDING;  Surgeon: Danie Binder, MD;  [facility];  Service: Endoscopy;  Laterality: N/A;  . NERVE, TENDON AND ARTERY REPAIR Left 02/21/2017   Procedure: WOUND EXPLORATION LEFT INDEX, LONG, AND RING FINGERS;  NERVE, TENDON AND ARTERY REPAIR;  Surgeon: Leanora Cover, MD;  Location: [facility];  Service: Orthopedics;  Laterality: Left;     No Known Allergies    Family History  Problem Relation Age of Onset  . Liver disease Brother        ?  etiology     Social History [name] reports that he has never smoked. He has never used smokeless tobacco. [name] reports current alcohol use.   Review of Systems CONSTITUTIONAL: No weight loss, fever, chills, weakness or fatigue.  HEENT: Eyes: No visual loss, blurred vision, double vision or yellow sclerae.No hearing loss, sneezing, congestion, runny nose or sore throat.  SKIN: No rash or itching.  CARDIOVASCULAR:  RESPIRATORY: No shortness of breath, cough or sputum.  GASTROINTESTINAL: No anorexia, nausea, vomiting or diarrhea. No abdominal pain or blood.  GENITOURINARY: No burning on urination, no polyuria NEUROLOGICAL: No headache, dizziness, syncope, paralysis, ataxia, numbness or tingling in the extremities. No change in bowel or bladder control.    MUSCULOSKELETAL: No muscle, back pain, joint pain or stiffness.  LYMPHATICS: No enlarged nodes. No history of splenectomy.  PSYCHIATRIC: No history of depression or anxiety.  ENDOCRINOLOGIC: No reports of sweating, cold or heat intolerance. No polyuria or polydipsia.  [name]   Physical Examination There were no vitals filed for this visit. There were no vitals filed for this visit.  Gen: resting comfortably, no acute distress HEENT: no scleral icterus, pupils equal round and reactive, no palptable cervical adenopathy,  CV Resp: Clear to auscultation bilaterally GI: abdomen is soft, non-tender, non-distended, normal bowel sounds, no hepatosplenomegaly MSK: extremities are warm, no edema.  Skin: warm, no rash Neuro:  no focal deficits Psych: appropriate affect   Diagnostic Studies  05/2013 Echo LVEF 60-65%, mild LVH, normal diastolic  0000000 Stress echo Stress results: Maximal heart rate during stress was 157bpm (97% of maximal predicted heart rate). The maximal predicted heart rate was 173bpm.The target heart rate was achieved. The heart rate response to stress was normal. There was a normal resting blood pressure with an appropriate response to stress. The rate-pressure product for the peak heart rate and blood pressure was 27932mm Hg/min. The patient experienced no chest pain during stress. Functional capacity was normal.  ------------------------------------------------------------ Baseline:  - The estimated LV ejection fraction was 55%. - Hypokinesis of the distallateral and apical LV myocardium. Immediate post stress:  - LV global systolic function was vigorous. - Normal wall motion; no LV regional wall motion abnormalities.  02/2014 PFTs Mild obstruction consistent with mild asthma/COPD    Assessment and Plan  1.PersistentAfib -dynamap heart rates do not match heart rates by EKG, today he is afib in high 90s - remains symptomatic, increase lopressor to  75mg  bid. Careful titration as he tends to have low heart rates when in SR - would plan for DCCV once [place] assistance or his job insurance becomes active - continue eliquis for now with potential pending cardioversion, his CHADS2Vasc score is 1 so may be able to stop in the future.  - obtain echo once insurance is active     2. OSA screen -signs and symptoms of OSA - obtain sleep study when insurance is active      Arnoldo Lenis, M.D., F.A.C.C.

## 2019-06-08 NOTE — Progress Notes (Addendum)
Cardiology Office Note  Date: 06/10/2019   ID: RECARDO Oliver, DOB 27-Feb-1966, MRN [id number]  PCP:  Sharilyn Sites, MD  Cardiologist:  Carlyle Dolly, MD Electrophysiologist:  None   Chief Complaint: Follow-up palpitations, bradycardia, atrial fibrillation  History of Present Illness: [name] is a 54 y.o. male with a history of hypertension, palpitations, bradycardia, atrial fibrillation.  Last seen by Dr. Harl Bowie 02/15/2019 where the event monitor showed coarse A. fib versus atrial flutter with several RVR episode.  Apparently he did not follow-up and there had been a plan to recheck EKG in a few weeks after last visit and plan for DCCV if he was still in atrial fibrillation.  He was having some DOE with activities with associated fatigue.  Plan was also for an echocardiogram once he had insurance for evaluation of atrial fibrillation affecting structural function of the heart and his complaints of dyspnea on exertion.  He was having positive episodes of snoring, apnea and daytime somnolence.  He had been referred to Dr. Luan Pulling for evaluation and possible sleep study but at the time had no insurance.  Patient denies any significant current complaints other than exertional dyspnea when performing more than usual activity.  He states he does not have significant dyspnea when walking on flat ground but when performing activities requiring more than usual exertional effort he becomes dyspneic.  His atrial fibrillation rate is controlled at 76 today.  Blood pressure was initially elevated at 122/100.  Recheck in left arm 120/90.  Denies any CVA or TIA-like symptoms, orthostatic symptoms other than mild dizziness on occasion.  States now that he has insurance he is willing to undergo DC cardioversion, echocardiogram, and referral for sleep study by pulmonology.  Past Medical History:  Diagnosis Date  . Anal fissure   . Anxiety   . Asthma    "mild" per patient. inhaler prn  . Cancer  ([place])    right shoulder melanoma  . COPD (chronic obstructive pulmonary disease) ([place])    "mild" per patient, no smoking history, possibly due to working in a [employer]  . Depression   . GERD (gastroesophageal reflux disease)   . H/O hiatal hernia   . History of kidney stones   . Hypertension   . Obesity (BMI 30-39.9) AUG 2013 266 LBS   FEB 2015 284 LBS    Past Surgical History:  Procedure Laterality Date  . anterior cervical disectomy    . APPENDECTOMY    . CIRCUMCISION    . COLONOSCOPY  11/21/2011   MOD IH    . DG ARTHRO THUMB*L*     removal of cyst  . FLEXIBLE SIGMOIDOSCOPY N/A 04/12/2013   HEMRRHOID BANDING X3.   . HEMORRHOID BANDING N/A 04/12/2013   Procedure: HEMORRHOID BANDING;  Surgeon: Danie Binder, MD;  [facility];  Service: Endoscopy;  Laterality: N/A;  . NERVE, TENDON AND ARTERY REPAIR Left 02/21/2017   Procedure: WOUND EXPLORATION LEFT INDEX, LONG, AND RING FINGERS;  NERVE, TENDON AND ARTERY REPAIR;  Surgeon: Leanora Cover, MD;  Location: [facility];  Service: Orthopedics;  Laterality: Left;    Current Outpatient Medications  Medication Sig Dispense Refill  . acetaminophen (TYLENOL) 500 MG tablet Take 1,000 mg by mouth every 6 (six) hours as needed. Pain    . albuterol (PROAIR HFA) 108 (90 Base) MCG/ACT inhaler Inhale 1 puff into the lungs every 6 (six) hours as needed for wheezing or shortness of breath. 3.7 g 1  .  ALPRAZolam (XANAX) 0.5 MG tablet Take 0.5 mg by mouth at bedtime as needed. For sleep    . apixaban (ELIQUIS) 5 MG TABS tablet Take 1 tablet (5 mg total) by mouth 2 (two) times daily. 56 tablet 0  . escitalopram (LEXAPRO) 20 MG tablet Take 20 mg by mouth daily.    Marland Kitchen losartan (COZAAR) 100 MG tablet Take 100 mg by mouth daily.    . Magnesium 250 MG TABS Take 1 tablet by mouth daily.    . metoprolol tartrate (LOPRESSOR) 50 MG tablet Take 1.5 tablets (75 mg total) by mouth 2 (two) times daily. 270 tablet 1  . Vitamin D,  Cholecalciferol, 25 MCG (1000 UT) TABS Take 1 capsule by mouth daily.     No current facility-administered medications for this visit.   Allergies:  Patient has no known allergies.   Social History: The patient  reports that he has never smoked. He has never used smokeless tobacco. He reports current alcohol use. He reports that he does not use drugs.   Family History: The patient's family history includes Liver disease in his brother.   ROS:  Please see the history of present illness. Otherwise, complete review of systems is positive for none.  All other systems are reviewed and negative.    Physical Exam: VS:  BP (!) 122/100   Pulse 76   Ht 6\' 2"  (1.88 m)   Wt 293 lb 12.8 oz (133.3 kg)   SpO2 97%   BMI 37.72 kg/m , BMI Body mass index is 37.72 kg/m.  Wt Readings from Last 3 Encounters:  06/10/19 293 lb 12.8 oz (133.3 kg)  02/15/19 (!) 303 lb 9.6 oz (137.7 kg)  01/06/19 299 lb (135.6 kg)    General: Patient appears comfortable at rest. Neck: Supple, no elevated JVP or carotid bruits, no thyromegaly. Lungs: Clear to auscultation, nonlabored breathing at rest. Cardiac: Regular rate and rhythm, no S3 or significant systolic murmur, no pericardial rub. Extremities: No pitting edema, distal pulses 2+.  Skin: Warm and dry. Musculoskeletal: No kyphosis. Neuropsychiatric: Alert and oriented x3, affect grossly appropriate.  ECG:  An ECG dated 06/10/2019 was personally reviewed today and demonstrated:  Atrial fibrillation occasional ectopic ventricular beat, diffuse nonspecific T wave abnormality, heart rate of 96  Recent Labwork: No results found for requested labs within last 8760 hours.  No results found for: CHOL, TRIG, HDL, CHOLHDL, VLDL, LDLCALC, LDLDIRECT  Other Studies Reviewed Today:  14-day event monitor resulted on 07/30/2018  Study Highlights   14 day event monitor  Rare ventricular ectopy in the form of isolated PVCs, couplets  Throughout study rhythm is coarse  afib vs aflutter (difficult to discern from single lead)with RVR, rates as high as 170.  2.4 second nocturnal pause while in afib  No diary submitted     05/2013 Echo LVEF 60-65%, mild LVH, normal diastolic  0000000 Stress echo Stress results: Maximal heart rate during stress was 157bpm (97% of maximal predicted heart rate). The maximal predicted heart rate was 173bpm.The target heart rate was achieved. The heart rate response to stress was normal. There was a normal resting blood pressure with an appropriate response to stress. The rate-pressure product for the peak heart rate and blood pressure was 2798mm Hg/min. The patient experienced no chest pain during stress. Functional capacity was normal.  ------------------------------------------------------------ Baseline:  - The estimated LV ejection fraction was 55%. - Hypokinesis of the distallateral and apical LV myocardium. Immediate post stress:  - LV global systolic function  was vigorous. - Normal wall motion; no LV regional wall motion abnormalities.  02/2014 PFTs Mild obstruction consistent with mild asthma/COPD  Assessment and Plan:  1. Atrial fibrillation, unspecified type ([place])   2. Essential hypertension   3. SOB (shortness of breath)   4. Palpitations   5. Snoring   6. Apnea   7. Daytime somnolence    1. Atrial fibrillation, unspecified type ([place]) EKG today shows atrial fibrillation with a rate of 96.  Patient continues on metoprolol tartrate 1.5 tablets p.o. twice daily (75 mg).  He continues on Eliquis 5 mg p.o. twice daily.  Patient states he is ready to have DCCV.  DCCV scheduled for 06/30/2019.   2. SOB (shortness of breath) He continues to complain of dyspnea on exertion.  States when walking on flat ground he has no significant dyspnea but when performing more than normal exertional activity he becomes short of breath.  Schedule an echocardiogram.  3.  Snoring, apnea, excessive daytime  somnolence. Patient admits to excessive snoring, periods of apnea, waking up at night short of breath, and excessive daytime somnolence.  Refer to pulmonology for evaluation for OSA and possible sleep study   Medication Adjustments/Labs and Tests Ordered: Current medicines are reviewed at length with the patient today.  Concerns regarding medicines are outlined above.   Disposition: Follow-up with  Dr. Harl Bowie or APP  Signed, Levell July, NP 06/10/2019 10:19 AM    [facility], [place], [place] [address] Phone: [phone]; Fax: [phone]

## 2019-06-10 ENCOUNTER — Encounter: Payer: Self-pay | Admitting: Family Medicine

## 2019-06-10 ENCOUNTER — Encounter: Payer: Self-pay | Admitting: *Deleted

## 2019-06-10 ENCOUNTER — Ambulatory Visit (INDEPENDENT_AMBULATORY_CARE_PROVIDER_SITE_OTHER): Payer: 59 | Admitting: Family Medicine

## 2019-06-10 ENCOUNTER — Telehealth: Payer: Self-pay | Admitting: Family Medicine

## 2019-06-10 ENCOUNTER — Other Ambulatory Visit: Payer: Self-pay

## 2019-06-10 ENCOUNTER — Telehealth: Payer: Self-pay | Admitting: *Deleted

## 2019-06-10 VITALS — BP 122/100 | HR 76 | Ht 74.0 in | Wt 293.8 lb

## 2019-06-10 DIAGNOSIS — R0602 Shortness of breath: Secondary | ICD-10-CM | POA: Diagnosis not present

## 2019-06-10 DIAGNOSIS — I10 Essential (primary) hypertension: Secondary | ICD-10-CM

## 2019-06-10 DIAGNOSIS — R0681 Apnea, not elsewhere classified: Secondary | ICD-10-CM

## 2019-06-10 DIAGNOSIS — R0683 Snoring: Secondary | ICD-10-CM

## 2019-06-10 DIAGNOSIS — R400 Somnolence: Secondary | ICD-10-CM

## 2019-06-10 DIAGNOSIS — I4891 Unspecified atrial fibrillation: Secondary | ICD-10-CM | POA: Diagnosis not present

## 2019-06-10 NOTE — Telephone Encounter (Signed)
Pre-cert Verification for the following procedure    ECHO    DATE:  06/16/2019  LOCATION: [facility]

## 2019-06-10 NOTE — Telephone Encounter (Signed)
Pre-cert Verification for the following procedure    DCCV Wednesday June 30, 2019 @1 :00 pm with Dr. Harl Bowie  DATE:  06/30/2019  LOCATION:   [facility]

## 2019-06-10 NOTE — Telephone Encounter (Signed)
DCCV 06/30/2019 @1 :00 PM with Dr. Harl Bowie at [facility]     PAT 06/28/19 8:30 am   Covid test 06/28/19 9:30 am

## 2019-06-10 NOTE — Patient Instructions (Signed)
Medication Instructions:   Your physician recommends that you continue on your current medications as directed. Please refer to the Current Medication list given to you today.  Labwork:  NONE  Testing/Procedures: Your physician has requested that you have an echocardiogram. Echocardiography is a painless test that uses sound waves to create images of your heart. It provides your doctor with information about the size and shape of your heart and how well your heart's chambers and valves are working. This procedure takes approximately one hour. There are no restrictions for this procedure. Your physician has recommended that you have a Cardioversion (DCCV). Electrical Cardioversion uses a jolt of electricity to your heart either through paddles or wired patches attached to your chest. This is a controlled, usually prescheduled, procedure. Defibrillation is done under light anesthesia in the hospital, and you usually go home the day of the procedure. This is done to get your heart back into a normal rhythm. You are not awake for the procedure. Please see the instruction sheet given to you today.  Follow-Up:  Your physician recommends that you schedule a follow-up appointment in: 2-3 weeks after your cardioversion (office)  Any Other Special Instructions Will Be Listed Below (If Applicable).  You have been referred to [facility].  If you need a refill on your cardiac medications before your next appointment, please call your pharmacy.

## 2019-06-11 ENCOUNTER — Encounter (HOSPITAL_COMMUNITY): Payer: Self-pay | Admitting: Cardiology

## 2019-06-11 NOTE — Telephone Encounter (Signed)
Patient informed of appointment information and verbalized understanding. Address given to patient for covid test ([place], [place])

## 2019-06-12 NOTE — Addendum Note (Signed)
Addended by: Luiz Blare on: 06/12/2019 01:59 AM   Modules accepted: Miquel Dunn

## 2019-06-16 ENCOUNTER — Ambulatory Visit (HOSPITAL_COMMUNITY)
Admission: RE | Admit: 2019-06-16 | Discharge: 2019-06-16 | Disposition: A | Discharge status: Home / Self Care | Payer: 59 | Source: Ambulatory Visit | Attending: Family Medicine | Admitting: Family Medicine

## 2019-06-16 ENCOUNTER — Other Ambulatory Visit: Payer: Self-pay

## 2019-06-16 DIAGNOSIS — I4891 Unspecified atrial fibrillation: Secondary | ICD-10-CM | POA: Diagnosis present

## 2019-06-16 NOTE — Progress Notes (Signed)
*  PRELIMINARY RESULTS* Echocardiogram 2D Echocardiogram has been performed.  [name] 06/16/2019, 9:20 AM

## 2019-06-18 ENCOUNTER — Telehealth: Payer: Self-pay | Admitting: *Deleted

## 2019-06-18 NOTE — Telephone Encounter (Signed)
Patient informed and verbalized understanding. Copy sent to PCP 

## 2019-06-18 NOTE — Telephone Encounter (Signed)
-----   Message from Verta Ellen., NP sent at 06/16/2019 10:34 AM EDT ----- Please call the patient and tell  him his echocardiogram showed the pumping function of his heart is reduced from previous echo in 2015. Tell him the reduction is more than likely d/t being in the abnormal rhythm (atrial fibrillation) for awhile. Blood pressure that has not been controlled well can affect the pumping function. Tell him the cardioversion he is having done soon (if successful to convert him back to a normal rhythm) will likely improve the pumping function. We will need to do a repeat echo few months or longer after he is cardioverted to see if the pumping has improved. Also tell him it is very important to take his blood pressure medications to keep BP under control. Tell him to please follow up with the sleep study. If he has sleep apnea this also puts stress on his heart. If he is positive for sleep apnea and needs CPAP he will need to be compliant with it . Also mention weight loss and lifestyle changes will help. Thank You

## 2019-06-25 ENCOUNTER — Other Ambulatory Visit: Payer: Self-pay | Admitting: Cardiology

## 2019-06-25 NOTE — Patient Instructions (Signed)
[name]  06/25/2019     @PREFPERIOPPHARMACY @   Your procedure is scheduled on  06/30/2019 .  Report to [facility] at  1140  A.M.  Call this number if you have problems the morning of surgery:  [phone]   Remember:  Do not eat or drink after midnight.                      Take these medicines the morning of surgery with A SIP OF WATER  Lexapro. Take your inhaler before you come. DO NOT miss any doses of eliquis before your procedure.    Do not wear jewelry, make-up or nail polish.  Do not wear lotions, powders, or perfumes. Please wear deodorant and brush your teeth.  Do not shave 48 hours prior to surgery.  Men may shave face and neck.  Do not bring valuables to the hospital.  [facility] is not responsible for any belongings or valuables.  Contacts, dentures or bridgework may not be worn into surgery.  Leave your suitcase in the car.  After surgery it may be brought to your room.  For patients admitted to the hospital, discharge time will be determined by your treatment team.  Patients discharged the day of surgery will not be allowed to drive home.   Name and phone number of your driver:   family Special instructions:  DO NOT smoke the morning of your procedure.  Please read over the following fact sheets that you were given. Anesthesia Post-op Instructions and Care and Recovery After Surgery       Electrical Cardioversion Electrical cardioversion is the delivery of a jolt of electricity to restore a normal rhythm to the heart. A rhythm that is too fast or is not regular keeps the heart from pumping well. In this procedure, sticky patches or metal paddles are placed on the chest to deliver electricity to the heart from a device. This procedure may be done in an emergency if:  There is low or no blood pressure as a result of the heart rhythm.  Normal rhythm must be restored as fast as possible to protect the brain and heart from further damage.  It  may save a life. This may also be a scheduled procedure for irregular or fast heart rhythms that are not immediately life-threatening. Tell a health care provider about:  Any allergies you have.  All medicines you are taking, including vitamins, herbs, eye drops, creams, and over-the-counter medicines.  Any problems you or family members have had with anesthetic medicines.  Any blood disorders you have.  Any surgeries you have had.  Any medical conditions you have.  Whether you are pregnant or may be pregnant. What are the risks? Generally, this is a safe procedure. However, problems may occur, including:  Allergic reactions to medicines.  A blood clot that breaks free and travels to other parts of your body.  The possible return of an abnormal heart rhythm within hours or days after the procedure.  Your heart stopping (cardiac arrest). This is rare. What happens before the procedure? Medicines  Your health care provider may have you start taking: ? Blood-thinning medicines (anticoagulants) so your blood does not clot as easily. ? Medicines to help stabilize your heart rate and rhythm.  Ask your health care provider about: ? Changing or stopping your regular medicines. This is especially important if you are taking diabetes medicines or blood thinners. ? Taking medicines  such as aspirin and ibuprofen. These medicines can thin your blood. Do not take these medicines unless your health care provider tells you to take them. ? Taking over-the-counter medicines, vitamins, herbs, and supplements. General instructions  Follow instructions from your health care provider about eating or drinking restrictions.  Plan to have someone take you home from the hospital or clinic.  If you will be going home right after the procedure, plan to have someone with you for 24 hours.  Ask your health care provider what steps will be taken to help prevent infection. These may include washing  your skin with a germ-killing soap. What happens during the procedure?   An IV will be inserted into one of your veins.  Sticky patches (electrodes) or metal paddles may be placed on your chest.  You will be given a medicine to help you relax (sedative).  An electrical shock will be delivered. The procedure may vary among health care providers and hospitals. What can I expect after the procedure?  Your blood pressure, heart rate, breathing rate, and blood oxygen level will be monitored until you leave the hospital or clinic.  Your heart rhythm will be watched to make sure it does not change.  You may have some redness on the skin where the shocks were given. Follow these instructions at home:  Do not drive for 24 hours if you were given a sedative during your procedure.  Take over-the-counter and prescription medicines only as told by your health care provider.  Ask your health care provider how to check your pulse. Check it often.  Rest for 48 hours after the procedure or as told by your health care provider.  Avoid or limit your caffeine use as told by your health care provider.  Keep all follow-up visits as told by your health care provider. This is important. Contact a health care provider if:  You feel like your heart is beating too quickly or your pulse is not regular.  You have a serious muscle cramp that does not go away. Get help right away if:  You have discomfort in your chest.  You are dizzy or you feel faint.  You have trouble breathing or you are short of breath.  Your speech is slurred.  You have trouble moving an arm or leg on one side of your body.  Your fingers or toes turn cold or blue. Summary  Electrical cardioversion is the delivery of a jolt of electricity to restore a normal rhythm to the heart.  This procedure may be done right away in an emergency or may be a scheduled procedure if the condition is not an emergency.  Generally, this is  a safe procedure.  After the procedure, check your pulse often as told by your health care provider. This information is not intended to replace advice given to you by your health care provider. Make sure you discuss any questions you have with your health care provider. Document Revised: 09/21/2018 Document Reviewed: 09/21/2018 Elsevier Patient Education  [place] After These instructions provide you with information about caring for yourself after your procedure. Your health care provider may also give you more specific instructions. Your treatment has been planned according to current medical practices, but problems sometimes occur. Call your health care provider if you have any problems or questions after your procedure. What can I expect after the procedure? After your procedure, you may:  Feel sleepy for several hours.  Feel clumsy  and have poor balance for several hours.  Feel forgetful about what happened after the procedure.  Have poor judgment for several hours.  Feel nauseous or vomit.  Have a sore throat if you had a breathing tube during the procedure. Follow these instructions at home: For at least 24 hours after the procedure:      Have a responsible adult stay with you. It is important to have someone help care for you until you are awake and alert.  Rest as needed.  Do not: ? Participate in activities in which you could fall or become injured. ? Drive. ? Use heavy machinery. ? Drink alcohol. ? Take sleeping pills or medicines that cause drowsiness. ? Make important decisions or sign legal documents. ? Take care of children on your own. Eating and drinking  Follow the diet that is recommended by your health care provider.  If you vomit, drink water, juice, or soup when you can drink without vomiting.  Make sure you have little or no nausea before eating solid foods. General instructions  Take over-the-counter  and prescription medicines only as told by your health care provider.  If you have sleep apnea, surgery and certain medicines can increase your risk for breathing problems. Follow instructions from your health care provider about wearing your sleep device: ? Anytime you are sleeping, including during daytime naps. ? While taking prescription pain medicines, sleeping medicines, or medicines that make you drowsy.  If you smoke, do not smoke without supervision.  Keep all follow-up visits as told by your health care provider. This is important. Contact a health care provider if:  You keep feeling nauseous or you keep vomiting.  You feel light-headed.  You develop a rash.  You have a fever. Get help right away if:  You have trouble breathing. Summary  For several hours after your procedure, you may feel sleepy and have poor judgment.  Have a responsible adult stay with you for at least 24 hours or until you are awake and alert. This information is not intended to replace advice given to you by your health care provider. Make sure you discuss any questions you have with your health care provider. Document Revised: 05/19/2017 Document Reviewed: 06/11/2015 Elsevier Patient Education  [place].

## 2019-06-28 ENCOUNTER — Other Ambulatory Visit (HOSPITAL_COMMUNITY)
Admission: RE | Admit: 2019-06-28 | Discharge: 2019-06-28 | Disposition: A | Discharge status: Home / Self Care | Payer: 59 | Source: Ambulatory Visit | Attending: Cardiology | Admitting: Cardiology

## 2019-06-28 ENCOUNTER — Other Ambulatory Visit: Payer: Self-pay

## 2019-06-28 ENCOUNTER — Telehealth: Payer: Self-pay | Admitting: *Deleted

## 2019-06-28 ENCOUNTER — Encounter (HOSPITAL_COMMUNITY): Payer: Self-pay

## 2019-06-28 ENCOUNTER — Encounter (HOSPITAL_COMMUNITY)
Admission: RE | Admit: 2019-06-28 | Discharge: 2019-06-28 | Disposition: A | Discharge status: Home / Self Care | Payer: 59 | Source: Ambulatory Visit | Attending: Cardiology | Admitting: Cardiology

## 2019-06-28 DIAGNOSIS — Z20822 Contact with and (suspected) exposure to covid-19: Secondary | ICD-10-CM | POA: Diagnosis not present

## 2019-06-28 DIAGNOSIS — Z01812 Encounter for preprocedural laboratory examination: Secondary | ICD-10-CM | POA: Diagnosis not present

## 2019-06-28 LAB — SARS CORONAVIRUS 2 (TAT 6-24 HRS): SARS Coronavirus 2: NEGATIVE

## 2019-06-28 NOTE — Progress Notes (Signed)
   06/28/19 0849  OBSTRUCTIVE SLEEP APNEA  Have you ever been diagnosed with sleep apnea through a sleep study? No  Do you snore loudly (loud enough to be heard through closed doors)?  1  Do you often feel tired, fatigued, or sleepy during the daytime (such as falling asleep during driving or talking to someone)? 1  Has anyone observed you stop breathing during your sleep? 1  Do you have, or are you being treated for high blood pressure? 1  BMI more than 35 kg/m2? 0  Age > 50 (1-yes) 1  Neck circumference greater than:Male 16 inches or larger, Male 17inches or larger? 0  Male Gender (Yes=1) 1  Obstructive Sleep Apnea Score 6  Score 5 or greater  Results sent to PCP

## 2019-06-28 NOTE — Telephone Encounter (Addendum)
Per Rosalyn Gess RN she spoke with Dr. Harl Bowie and said DCCV should be rescheduled.  Per Dr. Modena Morrow, must be 3 weeks of no missed eliquis doses. this makes sure there is no clot in the heart at the time of shocking, which would increase risk of stroke if present

## 2019-06-28 NOTE — Telephone Encounter (Signed)
Patient missed eliquis dose last Tuesday (06/22/19) and is scheduled for DCCV on Wednesday this week (06/30/19). Day Surgery calling to verify if its okay for patient to still have DCCV.

## 2019-06-29 NOTE — Telephone Encounter (Addendum)
Contacted [name] at [facility]) to reschedule DCCV DCCV Wednesday, Jul 21, 2019 @1 :15 pm with Dr. Harl Bowie PAT phone call 07/16/2019-per Hoyle Sauer but may change covid test 07/19/2019 @2 :30 pm

## 2019-06-29 NOTE — Telephone Encounter (Signed)
Per [name], a phone call for PAT is okay after she discussed with nursing staff at short stay.

## 2019-06-29 NOTE — Telephone Encounter (Signed)
Pt voiced understanding - aware we will call back to reschedule and not to miss any doses of Elqiuis

## 2019-06-29 NOTE — Telephone Encounter (Signed)
Patient informed and verbalized understanding of plan. 

## 2019-06-30 ENCOUNTER — Other Ambulatory Visit: Payer: Self-pay

## 2019-06-30 ENCOUNTER — Ambulatory Visit (INDEPENDENT_AMBULATORY_CARE_PROVIDER_SITE_OTHER): Payer: 59 | Admitting: Pulmonary Disease

## 2019-06-30 ENCOUNTER — Encounter: Payer: Self-pay | Admitting: Pulmonary Disease

## 2019-06-30 VITALS — BP 112/78 | HR 75 | Temp 97.0°F | Ht 74.0 in | Wt 298.0 lb

## 2019-06-30 DIAGNOSIS — G4733 Obstructive sleep apnea (adult) (pediatric): Secondary | ICD-10-CM

## 2019-06-30 DIAGNOSIS — R0683 Snoring: Secondary | ICD-10-CM | POA: Diagnosis not present

## 2019-06-30 DIAGNOSIS — J449 Chronic obstructive pulmonary disease, unspecified: Secondary | ICD-10-CM

## 2019-06-30 DIAGNOSIS — Z9189 Other specified personal risk factors, not elsewhere classified: Secondary | ICD-10-CM | POA: Diagnosis not present

## 2019-06-30 NOTE — Assessment & Plan Note (Addendum)
Given excessive daytime somnolence, narrow pharyngeal exam, witnessed apneas & loud snoring, obstructive sleep apnea is very likely & an overnight polysomnogram will be scheduled as a home study. The pathophysiology of obstructive sleep apnea , it's cardiovascular consequences & modes of treatment including CPAP were discused with the patient in detail & they evidenced understanding.  Pretest probability is high. Due to his cardiomyopathy , he may need a formal titration study Correlation with atrial fibrillation was discussed

## 2019-06-30 NOTE — Assessment & Plan Note (Signed)
Mild  airway obstruction on PFTs >> cannot categorize as COPD or asthma Use albuterol MDI prn

## 2019-06-30 NOTE — Progress Notes (Signed)
Subjective:    Patient ID: [name], male    DOB: 1965-11-12, 54 y.o.   MRN: [id number]  HPI  Chief Complaint  Patient presents with  . Consult    Patient is snoring and wakes up gasping for air. Patient states he only sleeps 5-6 hours he works 2nd shift (4pm-12am) Patient has been diagnosed with AFIB and has been tired more than normal.    54 year old man with atrial fibrillation and cardiomyopathy presents for evaluation of sleep disordered breathing. He reports loud snoring and nonrefreshing sleep.  His wife will frequently wake him up and ask him to roll over in the sleep.  He has occasionally woken up on the couch with episodes of gasping and choking in his sleep. 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 Epworth sleepiness score is 15 and reports sleepiness while watching TV, sitting inactive in a public place or as a passenger in a car.  Bedtime is between 1 AM and 3 AM, sleep latency is about an hour, he sleeps on his left side with 1 pillow, reports 2-3 nocturnal awakenings and is out of bed between 8 and 9 AM feeling tired with dryness of mouth. He has gained 20 pounds in the last 2 years  There is no history suggestive of cataplexy, sleep paralysis or parasomnias  Recent cardiac evaluation/consultation was reviewed-he was found to have atrial fibrillation with RVR on an event monitor Echo 06/2019 showed EF was down from 55% in the past to 35% with global hypokinesis.  He is on anticoagulation and eventual DCCV is planned  In the past he has been told that he has obstructive lung disease and albuterol MDI has been provided.  This has been attributed to is working in a [employer] tests/ events reviewed  PFTs 02/2014 showed mild airway obstruction with ratio of 68, FEV1 74%, FVC 85%, no bronchodilator response, TLC normal, DLCO 90%      Past Medical History:  Diagnosis  Date  . Anal fissure   . Anxiety   . Asthma    "mild" per patient. inhaler prn  . Cancer ([place])    right shoulder melanoma  . COPD (chronic obstructive pulmonary disease) ([place])    "mild" per patient, no smoking history, possibly due to working in a [employer]  . Depression   . GERD (gastroesophageal reflux disease)   . H/O hiatal hernia   . History of kidney stones   . Hypertension   . Obesity (BMI 30-39.9) AUG 2013 266 LBS   FEB 2015 284 LBS     Past Surgical History:  Procedure Laterality Date  . anterior cervical disectomy    . APPENDECTOMY    . CIRCUMCISION    . COLONOSCOPY  11/21/2011   MOD IH    . DG ARTHRO THUMB*L*     removal of cyst  . FLEXIBLE SIGMOIDOSCOPY N/A 04/12/2013   HEMRRHOID BANDING X3.   . HEMORRHOID BANDING N/A 04/12/2013   Procedure: HEMORRHOID BANDING;  Surgeon: Danie Binder, MD;  Location[facility];  Service: Endoscopy;  Laterality: N/A;  . NERVE, TENDON AND ARTERY REPAIR Left 02/21/2017   Procedure: WOUND EXPLORATION LEFT INDEX, LONG, AND RING FINGERS;  NERVE, TENDON AND ARTERY REPAIR;  Surgeon: Leanora Cover, MD;  Location[facility];  Service: Orthopedics;  Laterality: Left;    No Known Allergies  Social History  Socioeconomic History  . Marital status: Married    Spouse name: Not on file  . Number of children: 2  . Years of education: Not on file  . Highest education level: Not on file  Occupational History  . Occupation: Doctor, hospital  Tobacco Use  . Smoking status: Never Smoker  . Smokeless tobacco: Never Used  Substance and Sexual Activity  . Alcohol use: Yes    Alcohol/week: 0.0 standard drinks    Comment: socially  . Drug use: No  . Sexual activity: Yes    Birth control/protection: None  Other Topics Concern  . Not on file  Social History Narrative  . Not on file   Social Determinants of Health   Financial Resource Strain:   . Difficulty of Paying Living Expenses:   Food Insecurity:   . Worried About  Charity fundraiser in the Last Year:   . Arboriculturist in the Last Year:   Transportation Needs:   . Film/video editor (Medical):   Marland Kitchen Lack of Transportation (Non-Medical):   Physical Activity:   . Days of Exercise per Week:   . Minutes of Exercise per Session:   Stress:   . Feeling of Stress :   Social Connections:   . Frequency of Communication with Friends and Family:   . Frequency of Social Gatherings with Friends and Family:   . Attends Religious Services:   . Active Member of Clubs or Organizations:   . Attends Archivist Meetings:   Marland Kitchen Marital Status:   Intimate Partner Violence:   . Fear of Current or Ex-Partner:   . Emotionally Abused:   Marland Kitchen Physically Abused:   . Sexually Abused:      Family History  Problem Relation Age of Onset  . Liver disease Brother        ? etiology      Review of Systems Constitutional: negative for anorexia, fevers and sweats  Eyes: negative for irritation, redness and visual disturbance  Ears, nose, mouth, throat, and face: negative for earaches, epistaxis, nasal congestion and sore throat  Respiratory: negative for cough,  sputum and wheezing  Cardiovascular: negative for chest pain,  lower extremity edema, orthopnea, palpitations and syncope  Gastrointestinal: negative for abdominal pain, constipation, diarrhea, melena, nausea and vomiting  Genitourinary:negative for dysuria, frequency and hematuria  Hematologic/lymphatic: negative for bleeding, easy bruising and lymphadenopathy  Musculoskeletal:negative for arthralgias, muscle weakness and stiff joints  Neurological: negative for coordination problems, gait problems, headaches and weakness  Endocrine: negative for diabetic symptoms including polydipsia, polyuria and weight loss     Objective:   Physical Exam  Gen. Pleasant, obese, in no distress, normal affect ENT - no pallor,icterus, no post nasal drip, class 2 airway Neck: No JVD, no thyromegaly, no carotid  bruits Lungs: no use of accessory muscles, no dullness to percussion, decreased without rales or rhonchi  Cardiovascular: Rhythm regular, heart sounds  normal, no murmurs or gallops, no peripheral edema Abdomen: soft and non-tender, no hepatosplenomegaly, BS normal. Musculoskeletal: No deformities, no cyanosis or clubbing Neuro:  alert, non focal, no tremors       Assessment & Plan:

## 2019-06-30 NOTE — Patient Instructions (Addendum)
Home sleep study  Albuterol MDI only as needed for chest tightness or wheezing

## 2019-07-14 NOTE — Patient Instructions (Addendum)
Your procedure is scheduled on: 07/21/2019  Report to [facility] at 11:45    AM.  Call this number if you have problems the morning of surgery: [phone]   Remember:   Do not Eat or Drink after midnight         No Smoking the morning of surgery  :  Take these medicines the morning of surgery with A SIP OF WATER: lexapro, Eliquis and losartan  Do not wear jewelry, make-up or nail polish.  Do not wear lotions, powders, or perfumes. You may wear deodorant.  Do not shave 48 hours prior to surgery. Men may shave face and neck.  Do not bring valuables to the hospital.  Contacts, dentures or bridgework may not be worn into surgery.  Leave suitcase in the car. After surgery it may be brought to your room.  For patients admitted to the hospital, checkout time is 11:00 AM the day of discharge.   Patients discharged the day of surgery will not be allowed to drive home.    Electrical Cardioversion Electrical cardioversion is the delivery of a jolt of electricity to restore a normal rhythm to the heart. A rhythm that is too fast or is not regular keeps the heart from pumping well. In this procedure, sticky patches or metal paddles are placed on the chest to deliver electricity to the heart from a device. This procedure may be done in an emergency if:  There is low or no blood pressure as a result of the heart rhythm.  Normal rhythm must be restored as fast as possible to protect the brain and heart from further damage.  It may save a life. This may also be a scheduled procedure for irregular or fast heart rhythms that are not immediately life-threatening. Tell a health care provider about:  Any allergies you have.  All medicines you are taking, including vitamins, herbs, eye drops, creams, and over-the-counter medicines.  Any problems you or family members have had with anesthetic medicines.  Any blood disorders you have.  Any surgeries you have had.  Any medical conditions you  have.  Whether you are pregnant or may be pregnant. What are the risks? Generally, this is a safe procedure. However, problems may occur, including:  Allergic reactions to medicines.  A blood clot that breaks free and travels to other parts of your body.  The possible return of an abnormal heart rhythm within hours or days after the procedure.  Your heart stopping (cardiac arrest). This is rare. What happens before the procedure? Medicines  Your health care provider may have you start taking: ? Blood-thinning medicines (anticoagulants) so your blood does not clot as easily. ? Medicines to help stabilize your heart rate and rhythm.  Ask your health care provider about: ? Changing or stopping your regular medicines. This is especially important if you are taking diabetes medicines or blood thinners. ? Taking medicines such as aspirin and ibuprofen. These medicines can thin your blood. Do not take these medicines unless your health care provider tells you to take them. ? Taking over-the-counter medicines, vitamins, herbs, and supplements. General instructions  Follow instructions from your health care provider about eating or drinking restrictions.  Plan to have someone take you home from the hospital or clinic.  If you will be going home right after the procedure, plan to have someone with you for 24 hours.  Ask your health care provider what steps will be taken to help prevent infection. These may include washing  your skin with a germ-killing soap. What happens during the procedure?   An IV will be inserted into one of your veins.  Sticky patches (electrodes) or metal paddles may be placed on your chest.  You will be given a medicine to help you relax (sedative).  An electrical shock will be delivered. The procedure may vary among health care providers and hospitals. What can I expect after the procedure?  Your blood pressure, heart rate, breathing rate, and blood oxygen  level will be monitored until you leave the hospital or clinic.  Your heart rhythm will be watched to make sure it does not change.  You may have some redness on the skin where the shocks were given. Follow these instructions at home:  Do not drive for 24 hours if you were given a sedative during your procedure.  Take over-the-counter and prescription medicines only as told by your health care provider.  Ask your health care provider how to check your pulse. Check it often.  Rest for 48 hours after the procedure or as told by your health care provider.  Avoid or limit your caffeine use as told by your health care provider.  Keep all follow-up visits as told by your health care provider. This is important. Contact a health care provider if:  You feel like your heart is beating too quickly or your pulse is not regular.  You have a serious muscle cramp that does not go away. Get help right away if:  You have discomfort in your chest.  You are dizzy or you feel faint.  You have trouble breathing or you are short of breath.  Your speech is slurred.  You have trouble moving an arm or leg on one side of your body.  Your fingers or toes turn cold or blue. Summary  Electrical cardioversion is the delivery of a jolt of electricity to restore a normal rhythm to the heart.  This procedure may be done right away in an emergency or may be a scheduled procedure if the condition is not an emergency.  Generally, this is a safe procedure.  After the procedure, check your pulse often as told by your health care provider. This information is not intended to replace advice given to you by your health care provider. Make sure you discuss any questions you have with your health care provider. Document Revised: 09/21/2018 Document Reviewed: 09/21/2018 Elsevier Patient Education  [place] After These instructions provide you with information about  caring for yourself after your procedure. Your health care provider may also give you more specific instructions. Your treatment has been planned according to current medical practices, but problems sometimes occur. Call your health care provider if you have any problems or questions after your procedure. What can I expect after the procedure? After your procedure, you may:  Feel sleepy for several hours.  Feel clumsy and have poor balance for several hours.  Feel forgetful about what happened after the procedure.  Have poor judgment for several hours.  Feel nauseous or vomit.  Have a sore throat if you had a breathing tube during the procedure. Follow these instructions at home: For at least 24 hours after the procedure:      Have a responsible adult stay with you. It is important to have someone help care for you until you are awake and alert.  Rest as needed.  Do not: ? Participate in activities in which you could fall or  become injured. ? Drive. ? Use heavy machinery. ? Drink alcohol. ? Take sleeping pills or medicines that cause drowsiness. ? Make important decisions or sign legal documents. ? Take care of children on your own. Eating and drinking  Follow the diet that is recommended by your health care provider.  If you vomit, drink water, juice, or soup when you can drink without vomiting.  Make sure you have little or no nausea before eating solid foods. General instructions  Take over-the-counter and prescription medicines only as told by your health care provider.  If you have sleep apnea, surgery and certain medicines can increase your risk for breathing problems. Follow instructions from your health care provider about wearing your sleep device: ? Anytime you are sleeping, including during daytime naps. ? While taking prescription pain medicines, sleeping medicines, or medicines that make you drowsy.  If you smoke, do not smoke without supervision.  Keep  all follow-up visits as told by your health care provider. This is important. Contact a health care provider if:  You keep feeling nauseous or you keep vomiting.  You feel light-headed.  You develop a rash.  You have a fever. Get help right away if:  You have trouble breathing. Summary  For several hours after your procedure, you may feel sleepy and have poor judgment.  Have a responsible adult stay with you for at least 24 hours or until you are awake and alert. This information is not intended to replace advice given to you by your health care provider. Make sure you discuss any questions you have with your health care provider. Document Revised: 05/19/2017 Document Reviewed: 06/11/2015 Elsevier Patient Education  [place].

## 2019-07-16 ENCOUNTER — Other Ambulatory Visit: Payer: Self-pay

## 2019-07-16 ENCOUNTER — Encounter (HOSPITAL_COMMUNITY)
Admission: RE | Admit: 2019-07-16 | Discharge: 2019-07-16 | Disposition: A | Discharge status: Home / Self Care | Payer: 59 | Source: Ambulatory Visit | Attending: Cardiology | Admitting: Cardiology

## 2019-07-19 ENCOUNTER — Other Ambulatory Visit: Payer: Self-pay

## 2019-07-19 ENCOUNTER — Other Ambulatory Visit (HOSPITAL_COMMUNITY)
Admission: RE | Admit: 2019-07-19 | Discharge: 2019-07-19 | Disposition: A | Discharge status: Home / Self Care | Payer: 59 | Source: Ambulatory Visit | Attending: Cardiology | Admitting: Cardiology

## 2019-07-19 DIAGNOSIS — Z20822 Contact with and (suspected) exposure to covid-19: Secondary | ICD-10-CM | POA: Diagnosis not present

## 2019-07-19 DIAGNOSIS — Z01812 Encounter for preprocedural laboratory examination: Secondary | ICD-10-CM | POA: Insufficient documentation

## 2019-07-20 LAB — SARS CORONAVIRUS 2 (TAT 6-24 HRS): SARS Coronavirus 2: NEGATIVE

## 2019-07-21 ENCOUNTER — Encounter (HOSPITAL_COMMUNITY)
Admission: RE | Disposition: A | Discharge status: Home / Self Care | Payer: Self-pay | Source: Home / Self Care | Attending: Cardiology

## 2019-07-21 ENCOUNTER — Encounter (HOSPITAL_COMMUNITY): Payer: Self-pay | Admitting: Cardiology

## 2019-07-21 ENCOUNTER — Ambulatory Visit (HOSPITAL_COMMUNITY): Payer: 59 | Admitting: Anesthesiology

## 2019-07-21 ENCOUNTER — Ambulatory Visit (HOSPITAL_COMMUNITY)
Admission: RE | Admit: 2019-07-21 | Discharge: 2019-07-21 | Disposition: A | Discharge status: Home / Self Care | Payer: 59 | Attending: Cardiology | Admitting: Cardiology

## 2019-07-21 ENCOUNTER — Other Ambulatory Visit: Payer: Self-pay

## 2019-07-21 ENCOUNTER — Other Ambulatory Visit: Payer: Self-pay | Admitting: *Deleted

## 2019-07-21 DIAGNOSIS — R0681 Apnea, not elsewhere classified: Secondary | ICD-10-CM | POA: Diagnosis not present

## 2019-07-21 DIAGNOSIS — Z6838 Body mass index (BMI) 38.0-38.9, adult: Secondary | ICD-10-CM | POA: Insufficient documentation

## 2019-07-21 DIAGNOSIS — R0600 Dyspnea, unspecified: Secondary | ICD-10-CM | POA: Insufficient documentation

## 2019-07-21 DIAGNOSIS — I10 Essential (primary) hypertension: Secondary | ICD-10-CM | POA: Diagnosis not present

## 2019-07-21 DIAGNOSIS — R0683 Snoring: Secondary | ICD-10-CM | POA: Insufficient documentation

## 2019-07-21 DIAGNOSIS — F419 Anxiety disorder, unspecified: Secondary | ICD-10-CM | POA: Insufficient documentation

## 2019-07-21 DIAGNOSIS — K219 Gastro-esophageal reflux disease without esophagitis: Secondary | ICD-10-CM | POA: Insufficient documentation

## 2019-07-21 DIAGNOSIS — I4819 Other persistent atrial fibrillation: Secondary | ICD-10-CM | POA: Insufficient documentation

## 2019-07-21 DIAGNOSIS — F329 Major depressive disorder, single episode, unspecified: Secondary | ICD-10-CM | POA: Insufficient documentation

## 2019-07-21 DIAGNOSIS — Z85820 Personal history of malignant melanoma of skin: Secondary | ICD-10-CM | POA: Diagnosis not present

## 2019-07-21 DIAGNOSIS — E669 Obesity, unspecified: Secondary | ICD-10-CM | POA: Insufficient documentation

## 2019-07-21 DIAGNOSIS — J449 Chronic obstructive pulmonary disease, unspecified: Secondary | ICD-10-CM | POA: Insufficient documentation

## 2019-07-21 DIAGNOSIS — Z7901 Long term (current) use of anticoagulants: Secondary | ICD-10-CM | POA: Insufficient documentation

## 2019-07-21 DIAGNOSIS — Z79899 Other long term (current) drug therapy: Secondary | ICD-10-CM | POA: Insufficient documentation

## 2019-07-21 DIAGNOSIS — R400 Somnolence: Secondary | ICD-10-CM | POA: Diagnosis not present

## 2019-07-21 HISTORY — PX: CARDIOVERSION: SHX1299

## 2019-07-21 SURGERY — CARDIOVERSION
Anesthesia: General

## 2019-07-21 MED ORDER — PROPOFOL 10 MG/ML IV BOLUS
INTRAVENOUS | Status: AC
  Filled 2019-07-21: qty 40

## 2019-07-21 MED ORDER — ROCURONIUM BROMIDE 10 MG/ML (PF) SYRINGE
PREFILLED_SYRINGE | INTRAVENOUS | Status: AC
  Filled 2019-07-21: qty 10

## 2019-07-21 MED ORDER — METOPROLOL SUCCINATE ER 100 MG PO TB24: 100.0000 mg | ORAL_TABLET | Freq: Two times a day (BID) | ORAL | 11 refills | Status: DC

## 2019-07-21 MED ORDER — DEXAMETHASONE SODIUM PHOSPHATE 10 MG/ML IJ SOLN
INTRAMUSCULAR | Status: AC
  Filled 2019-07-21: qty 1

## 2019-07-21 MED ORDER — LACTATED RINGERS IV SOLN: INTRAVENOUS | Status: DC

## 2019-07-21 MED ORDER — ONDANSETRON HCL 4 MG/2ML IJ SOLN
INTRAMUSCULAR | Status: AC
  Filled 2019-07-21: qty 2

## 2019-07-21 MED ORDER — PROPOFOL 10 MG/ML IV BOLUS
INTRAVENOUS | Status: DC | PRN
  Administered 2019-07-21: 50 mg via INTRAVENOUS
  Administered 2019-07-21: 30 mg via INTRAVENOUS
  Administered 2019-07-21 (×2): 20 mg via INTRAVENOUS

## 2019-07-21 MED ORDER — LIDOCAINE 2% (20 MG/ML) 5 ML SYRINGE
INTRAMUSCULAR | Status: AC
  Filled 2019-07-21: qty 10

## 2019-07-21 NOTE — Progress Notes (Signed)
Electrical Cardioversion Procedure Note BERIC PANAGAKOS [id number] 1966-01-17  Procedure: Electrical Cardioversion Indications: constant  Atrial Fibrillation Procedure Details Consent: verified Time Out: Verified patient identification, verified procedure, site/side was marked, verified correct patient position, special equipment/implants available, medications/allergies/relevent history reviewed, required imaging and test results available.   Time Out : 1135 Patient placed on cardiac monitor, pulse oximetry, supplemental oxygen as necessary.  Sedation given: yes Pacer pads placed yes Cardioverted  3  time(s).  Cardioverted at 200 Joules Evaluation Findings: Post procedure EKG shows: Atrial Fibrilation Complications: no complications Patient id tolerate procedure well.   Hillery Jacks 07/21/2019, 12:36 PM

## 2019-07-21 NOTE — Transfer of Care (Signed)
Immediate Anesthesia Transfer of Care Note  Patient: [name]  Procedure(s) Performed: CARDIOVERSION (N/A )  Patient Location: PACU  Anesthesia Type:General  Level of Consciousness: awake  Airway & Oxygen Therapy: Patient Spontanous Breathing  Post-op Assessment: Report given to RN  Post vital signs: Reviewed  Last Vitals:  Vitals Value Taken Time  BP    Temp    Pulse 89 07/21/19 1153  Resp 14 07/21/19 1153  SpO2 95 % 07/21/19 1153  Vitals shown include unvalidated device data.  Last Pain:  Vitals:   07/21/19 1115  PainSc: 0-No pain         Complications: No apparent anesthesia complications

## 2019-07-21 NOTE — Anesthesia Postprocedure Evaluation (Signed)
Anesthesia Post Note  Patient: [name]  Procedure(s) Performed: CARDIOVERSION (N/A )  Patient location during evaluation: PACU Anesthesia Type: General Level of consciousness: awake and alert and oriented Pain management: pain level controlled Vital Signs Assessment: post-procedure vital signs reviewed and stable Respiratory status: spontaneous breathing Cardiovascular status: blood pressure returned to baseline and stable Postop Assessment: no apparent nausea or vomiting Anesthetic complications: no     Last Vitals:  Vitals:   07/21/19 1115  BP: 107/86  Pulse: (!) 55  Resp: 10  Temp: 37.1 C  SpO2: 96%    Last Pain:  Vitals:   07/21/19 1115  PainSc: 0-No pain                 [name]

## 2019-07-21 NOTE — CV Procedure (Signed)
CV procedure note  Procedure: Electrical cardioversion Indication: Persistent afib Physician: Dr Carlyle Dolly MD   Patient was brought to the procedure suite after approriate consent was obtained. Defib pads were placed in the anterior and posterior positions. Sedation was achieved with the assistance of anesthesiology, please see there notes for details. Synchronized 200J shocks x 3, unsuccesful. Remained in afib. Tolerated procedure without difficultly, cardiopulmonary monitoring was performed throughout.  Continue rate control, change his lopressor to toprol 100mg  bid in setting of systolic dysfunction. Close f/u likely to change losartan to entresto. Continue rhythm control and sleep apnea eval, will need to discuss possible ischemic evaluation as well in the near future.    Carlyle Dolly MD

## 2019-07-21 NOTE — Anesthesia Preprocedure Evaluation (Signed)
Anesthesia Evaluation  Patient identified by MRN, date of birth, ID band Patient awake    Reviewed: Allergy & Precautions, H&P , NPO status , Patient's Chart, lab work & pertinent test results, reviewed documented beta blocker date and time   Airway Mallampati: II  TM Distance: >3 FB Neck ROM: full    Dental no notable dental hx. (+) Teeth Intact   Pulmonary asthma , sleep apnea , COPD,    Pulmonary exam normal breath sounds clear to auscultation       Cardiovascular Exercise Tolerance: Good hypertension, negative cardio ROS   Rhythm:regular Rate:Normal     Neuro/Psych PSYCHIATRIC DISORDERS Anxiety Depression negative neurological ROS     GI/Hepatic Neg liver ROS, hiatal hernia, GERD  Medicated,  Endo/Other  negative endocrine ROS  Renal/GU negative Renal ROS  negative genitourinary   Musculoskeletal   Abdominal   Peds  Hematology negative hematology ROS (+)   Anesthesia Other Findings   Reproductive/Obstetrics negative OB ROS                             Anesthesia Physical Anesthesia Plan  ASA: III  Anesthesia Plan: General   Post-op Pain Management:    Induction:   PONV Risk Score and Plan: 2 and Propofol infusion  Airway Management Planned:   Additional Equipment:   Intra-op Plan:   Post-operative Plan:   Informed Consent: I have reviewed the patients History and Physical, chart, labs and discussed the procedure including the risks, benefits and alternatives for the proposed anesthesia with the patient or authorized representative who has indicated his/her understanding and acceptance.     Dental Advisory Given  Plan Discussed with: CRNA  Anesthesia Plan Comments:         Anesthesia Quick Evaluation

## 2019-07-21 NOTE — Discharge Instructions (Signed)
Electrical Cardioversion Electrical cardioversion is the delivery of a jolt of electricity to restore a normal rhythm to the heart. A rhythm that is too fast or is not regular keeps the heart from pumping well. In this procedure, sticky patches or metal paddles are placed on the chest to deliver electricity to the heart from a device. This procedure may be done in an emergency if:  There is low or no blood pressure as a result of the heart rhythm.  Normal rhythm must be restored as fast as possible to protect the brain and heart from further damage.  It may save a life. This may also be a scheduled procedure for irregular or fast heart rhythms that are not immediately life-threatening. Tell a health care provider about:  Any allergies you have.  All medicines you are taking, including vitamins, herbs, eye drops, creams, and over-the-counter medicines.  Any problems you or family members have had with anesthetic medicines.  Any blood disorders you have.  Any surgeries you have had.  Any medical conditions you have.  Whether you are pregnant or may be pregnant. What are the risks? Generally, this is a safe procedure. However, problems may occur, including:  Allergic reactions to medicines.  A blood clot that breaks free and travels to other parts of your body.  The possible return of an abnormal heart rhythm within hours or days after the procedure.  Your heart stopping (cardiac arrest). This is rare. What happens before the procedure? Medicines  Your health care provider may have you start taking: ? Blood-thinning medicines (anticoagulants) so your blood does not clot as easily. ? Medicines to help stabilize your heart rate and rhythm.  Ask your health care provider about: ? Changing or stopping your regular medicines. This is especially important if you are taking diabetes medicines or blood thinners. ? Taking medicines such as aspirin and ibuprofen. These medicines can  thin your blood. Do not take these medicines unless your health care provider tells you to take them. ? Taking over-the-counter medicines, vitamins, herbs, and supplements. General instructions  Follow instructions from your health care provider about eating or drinking restrictions.  Plan to have someone take you home from the hospital or clinic.  If you will be going home right after the procedure, plan to have someone with you for 24 hours.  Ask your health care provider what steps will be taken to help prevent infection. These may include washing your skin with a germ-killing soap. What happens during the procedure?   An IV will be inserted into one of your veins.  Sticky patches (electrodes) or metal paddles may be placed on your chest.  You will be given a medicine to help you relax (sedative).  An electrical shock will be delivered. The procedure may vary among health care providers and hospitals. What can I expect after the procedure?  Your blood pressure, heart rate, breathing rate, and blood oxygen level will be monitored until you leave the hospital or clinic.  Your heart rhythm will be watched to make sure it does not change.  You may have some redness on the skin where the shocks were given. Follow these instructions at home:  Do not drive for 24 hours if you were given a sedative during your procedure.  Take over-the-counter and prescription medicines only as told by your health care provider.  Ask your health care provider how to check your pulse. Check it often.  Rest for 48 hours after the procedure or   as told by your health care provider.  Avoid or limit your caffeine use as told by your health care provider.  Keep all follow-up visits as told by your health care provider. This is important. Contact a health care provider if:  You feel like your heart is beating too quickly or your pulse is not regular.  You have a serious muscle cramp that does not go  away. Get help right away if:  You have discomfort in your chest.  You are dizzy or you feel faint.  You have trouble breathing or you are short of breath.  Your speech is slurred.  You have trouble moving an arm or leg on one side of your body.  Your fingers or toes turn cold or blue. Summary  Electrical cardioversion is the delivery of a jolt of electricity to restore a normal rhythm to the heart.  This procedure may be done right away in an emergency or may be a scheduled procedure if the condition is not an emergency.  Generally, this is a safe procedure.  After the procedure, check your pulse often as told by your health care provider. This information is not intended to replace advice given to you by your health care provider. Make sure you discuss any questions you have with your health care provider. Document Revised: 09/21/2018 Document Reviewed: 09/21/2018 Elsevier Patient Education  2020 [employer]  

## 2019-07-21 NOTE — H&P (Signed)
Patient presents for outpatient elective electrical cardioversion for persistent afib, please see referenced clinic note for full history. He has been on eliquis at home. Plan for cardioversion today with the assistance of anesthesia  Roy Dolly MD    Cardiology Office Note  Date: 06/10/2019  ID: [name], DOB 03/09/1965, MRN [id number]  PCP: Roy Sites, MD  Cardiologist: Roy Dolly, MD  Electrophysiologist: None  Chief Complaint: Follow-up palpitations, bradycardia, atrial fibrillation  History of Present Illness:  [name] is a 54 y.o. male with a history of hypertension, palpitations, bradycardia, atrial fibrillation.  Last seen by [name] 02/15/2019 where the event monitor showed coarse A. fib versus atrial flutter with several RVR episode. Apparently he did not follow-up and there had been a plan to recheck EKG in a few weeks after last visit and plan for DCCV if he was still in atrial fibrillation. He was having some DOE with activities with associated fatigue. Plan was also for an echocardiogram once he had insurance for evaluation of atrial fibrillation affecting structural function of the heart and his complaints of dyspnea on exertion. He was having positive episodes of snoring, apnea and daytime somnolence. He had been referred to [name] for evaluation and possible sleep study but at the time had no insurance.  Patient denies any significant current complaints other than exertional dyspnea when performing more than usual activity. He states he does not have significant dyspnea when walking on flat ground but when performing activities requiring more than usual exertional effort he becomes dyspneic. His atrial fibrillation rate is controlled at 76 today. Blood pressure was initially elevated at 122/100. Recheck in left arm 120/90. Denies any CVA or TIA-like symptoms, orthostatic symptoms other than mild dizziness on occasion. States now that he has insurance  he is willing to undergo DC cardioversion, echocardiogram, and referral for sleep study by pulmonology.      Past Medical History:  Diagnosis Date  . Anal fissure   . Anxiety   . Asthma    "mild" per patient. inhaler prn  . Cancer ([place])    right shoulder melanoma  . COPD (chronic obstructive pulmonary disease) ([place])    "mild" per patient, no smoking history, possibly due to working in a [employer]  . Depression   . GERD (gastroesophageal reflux disease)   . H/O hiatal hernia   . History of kidney stones   . Hypertension   . Obesity (BMI 30-39.9) AUG 2013 266 LBS   FEB 2015 284 LBS        Past Surgical History:  Procedure Laterality Date  . anterior cervical disectomy    . APPENDECTOMY    . CIRCUMCISION    . COLONOSCOPY  11/21/2011   MOD IH   . DG ARTHRO THUMB*L*     removal of cyst  . FLEXIBLE SIGMOIDOSCOPY N/A 04/12/2013   HEMRRHOID BANDING X3.   . HEMORRHOID BANDING N/A 04/12/2013   Procedure: HEMORRHOID BANDING; Surgeon: Danie Binder, MD; [facility]; Service: Endoscopy; Laterality: N/A;  . NERVE, TENDON AND ARTERY REPAIR Left 02/21/2017   Procedure: WOUND EXPLORATION LEFT INDEX, LONG, AND RING FINGERS; NERVE, TENDON AND ARTERY REPAIR; Surgeon: Leanora Cover, MD; Location: [facility]; Service: Orthopedics; Laterality: Left;         Current Outpatient Medications  Medication Sig Dispense Refill  . acetaminophen (TYLENOL) 500 MG tablet Take 1,000 mg by mouth every 6 (six) hours as needed. Pain    . albuterol (PROAIR  HFA) 108 (90 Base) MCG/ACT inhaler Inhale 1 puff into the lungs every 6 (six) hours as needed for wheezing or shortness of breath. 3.7 g 1  . ALPRAZolam (XANAX) 0.5 MG tablet Take 0.5 mg by mouth at bedtime as needed. For sleep    . apixaban (ELIQUIS) 5 MG TABS tablet Take 1 tablet (5 mg total) by mouth 2 (two) times daily. 56 tablet 0  . escitalopram (LEXAPRO) 20 MG tablet Take 20 mg by mouth daily.    Marland Kitchen losartan (COZAAR) 100 MG  tablet Take 100 mg by mouth daily.    . Magnesium 250 MG TABS Take 1 tablet by mouth daily.    . metoprolol tartrate (LOPRESSOR) 50 MG tablet Take 1.5 tablets (75 mg total) by mouth 2 (two) times daily. 270 tablet 1  . Vitamin D, Cholecalciferol, 25 MCG (1000 UT) TABS Take 1 capsule by mouth daily.     No current facility-administered medications for this visit.   Allergies: Patient has no known allergies.  Social History: The patient reports that he has never smoked. He has never used smokeless tobacco. He reports current alcohol use. He reports that he does not use drugs.  Family History: The patient's family history includes Liver disease in his brother.  ROS: Please see the history of present illness. Otherwise, complete review of systems is positive for none. All other systems are reviewed and negative.  Physical Exam:  VS: BP (!) 122/100  Pulse 76  Ht 6\' 2"  (1.88 m)  Wt 293 lb 12.8 oz (133.3 kg)  SpO2 97%  BMI 37.72 kg/m , BMI Body mass index is 37.72 kg/m.     Wt Readings from Last 3 Encounters:  06/10/19 293 lb 12.8 oz (133.3 kg)  02/15/19 (!) 303 lb 9.6 oz (137.7 kg)  01/06/19 299 lb (135.6 kg)   General: Patient appears comfortable at rest.  Neck: Supple, no elevated JVP or carotid bruits, no thyromegaly.  Lungs: Clear to auscultation, nonlabored breathing at rest.  Cardiac: Regular rate and rhythm, no S3 or significant systolic murmur, no pericardial rub.  Extremities: No pitting edema, distal pulses 2+.  Skin: Warm and dry.  Musculoskeletal: No kyphosis.  Neuropsychiatric: Alert and oriented x3, affect grossly appropriate.  ECG: An ECG dated 06/10/2019 was personally reviewed today and demonstrated: Atrial fibrillation occasional ectopic ventricular beat, diffuse nonspecific T wave abnormality, heart rate of 96  Recent Labwork:  No results found for requested labs within last 8760 hours.  Labs (Brief)    Other Studies Reviewed Today:  14-day event monitor resulted on  07/30/2018  Study Highlights  14 day event monitor  Rare ventricular ectopy in the form of isolated PVCs, couplets  Throughout study rhythm is coarse afib vs aflutter (difficult to discern from single lead)with RVR, rates as high as 170.  2.4 second nocturnal pause while in afib  No diary submitted  05/2013 Echo  LVEF 60-65%, mild LVH, normal diastolic  0000000 Stress echo  Stress results: Maximal heart rate during stress was 157bpm (97% of maximal predicted heart rate). The maximal predicted heart rate was 173bpm.The target heart rate was achieved. The heart rate response to stress was normal. There was a normal resting blood pressure with an appropriate response to stress. The rate-pressure product for the peak heart rate and blood pressure was 27950mm Hg/min. The patient experienced no chest pain during stress. Functional capacity was normal.  ------------------------------------------------------------ Baseline:  - The estimated LV ejection fraction was 55%. - Hypokinesis of the distallateral  and apical LV myocardium. Immediate post stress:  - LV global systolic function was vigorous. - Normal wall motion; no LV regional wall motion abnormalities.  02/2014 PFTs  Mild obstruction consistent with mild asthma/COPD  Assessment and Plan:  1. Atrial fibrillation, unspecified type ([place])   2. Essential hypertension   3. SOB (shortness of breath)   4. Palpitations   5. Snoring   6. Apnea   7. Daytime somnolence    1. Atrial fibrillation, unspecified type ([place])  EKG today shows atrial fibrillation with a rate of 96. Patient continues on metoprolol tartrate 1.5 tablets p.o. twice daily (75 mg). He continues on Eliquis 5 mg p.o. twice daily. Patient states he is ready to have DCCV. DCCV scheduled for 06/30/2019.  2. SOB (shortness of breath)  He continues to complain of dyspnea on exertion. States when walking on flat ground he has no significant dyspnea but when performing more than  normal exertional activity he becomes short of breath. Schedule an echocardiogram.  3. Snoring, apnea, excessive daytime somnolence.  Patient admits to excessive snoring, periods of apnea, waking up at night short of breath, and excessive daytime somnolence. Refer to pulmonology for evaluation for OSA and possible sleep study  Medication Adjustments/Labs and Tests Ordered:  Current medicines are reviewed at length with the patient today. Concerns regarding medicines are outlined above.  Disposition: Follow-up with [name] or APP  Signed,  Levell July, NP  06/10/2019 10:19 AM  [facility], [place], [place] [address]  Phone: [phone]; Fax: [phone]

## 2019-07-23 ENCOUNTER — Other Ambulatory Visit: Payer: Self-pay | Admitting: *Deleted

## 2019-07-23 ENCOUNTER — Encounter: Payer: Self-pay | Admitting: *Deleted

## 2019-07-23 ENCOUNTER — Telehealth: Payer: Self-pay | Admitting: Cardiology

## 2019-07-23 MED ORDER — APIXABAN 5 MG PO TABS: 5.0000 mg | ORAL_TABLET | Freq: Two times a day (BID) | ORAL | 2 refills | Status: DC

## 2019-07-23 NOTE — Telephone Encounter (Signed)
Patient asking for a note for work  Stated he spoke with someone last week about getting the note

## 2019-07-23 NOTE — Telephone Encounter (Signed)
Note provided - he will pick up this evening.

## 2019-07-26 ENCOUNTER — Encounter: Payer: Self-pay | Admitting: Cardiology

## 2019-07-26 ENCOUNTER — Ambulatory Visit (INDEPENDENT_AMBULATORY_CARE_PROVIDER_SITE_OTHER): Payer: 59 | Admitting: Cardiology

## 2019-07-26 ENCOUNTER — Other Ambulatory Visit: Payer: Self-pay

## 2019-07-26 VITALS — BP 102/64 | HR 80 | Ht 74.0 in | Wt 298.8 lb

## 2019-07-26 DIAGNOSIS — I4819 Other persistent atrial fibrillation: Secondary | ICD-10-CM | POA: Diagnosis not present

## 2019-07-26 DIAGNOSIS — I5022 Chronic systolic (congestive) heart failure: Secondary | ICD-10-CM | POA: Diagnosis not present

## 2019-07-26 MED ORDER — ENTRESTO 49-51 MG PO TABS: 1.0000 | ORAL_TABLET | Freq: Two times a day (BID) | ORAL | 0 refills | Status: DC

## 2019-07-26 MED ORDER — ENTRESTO 49-51 MG PO TABS: 1.0000 | ORAL_TABLET | Freq: Two times a day (BID) | ORAL | 3 refills | Status: DC

## 2019-07-26 NOTE — Patient Instructions (Signed)
Your physician recommends that you schedule a follow-up appointment in: [place] has recommended you make the following change in your medication:   STOP LOSARTAN   START ENTRESTO 49/51 MG TWICE DAILY - WE HAVE GIVEN YOU SAMPLES AND PHARMACY VOUCHER   Your physician recommends that you return for lab work in: 2 WEEKS BMP/MG/TSH  Thank you for choosing [facility]!!

## 2019-07-26 NOTE — Progress Notes (Signed)
Clinical Summary [name] is a 54 y.o.male seen today for follow up of the following medical problems.   1.Afib - 07/2018 2 week eventmonitor showed coarse afib vs aflutter with several RVR episodes, new diagnosis for patientat that time - he did not follow up. We had planned to recheck EKG in a few weeks after our last visit and plan for DCCV if he was still in afib -further delay in cardioversion attempt awaiting [facility]  - s/p DCCV attempt 5/19, failed to convert to SR.  - mild palpitations at times but infrequent   2. Chronic systolic HF - 99991111 echo LVEF 30-35%, mod RV dysfunction - changed lopressor to toprol 100mg  bid.  - no LE edema. No significant SOB/DOE  3. OSA screen - +snoring, +apnea, + daytime somonlence - evaluation previously held due to cost and insurance concerns - has upcoming sleep study      Past Medical History:  Diagnosis Date  . Anal fissure   . Anxiety   . Asthma    "mild" per patient. inhaler prn  . Cancer ([place])    right shoulder melanoma  . COPD (chronic obstructive pulmonary disease) ([place])    "mild" per patient, no smoking history, possibly due to working in a [employer]  . Depression   . GERD (gastroesophageal reflux disease)   . H/O hiatal hernia   . History of kidney stones   . Hypertension   . Obesity (BMI 30-39.9) AUG 2013 266 LBS   FEB 2015 284 LBS     No Known Allergies   Current Outpatient Medications  Medication Sig Dispense Refill  . acetaminophen (TYLENOL) 500 MG tablet Take 1,000 mg by mouth every 6 (six) hours as needed for moderate pain.     Marland Kitchen albuterol (PROAIR HFA) 108 (90 Base) MCG/ACT inhaler Inhale 1 puff into the lungs every 6 (six) hours as needed for wheezing or shortness of breath. 3.7 g 1  . apixaban (ELIQUIS) 5 MG TABS tablet Take 1 tablet (5 mg total) by mouth 2 (two) times daily. 180 tablet 2  . escitalopram (LEXAPRO) 20 MG tablet Take 20 mg by mouth daily.    Marland Kitchen losartan (COZAAR)  100 MG tablet Take 100 mg by mouth daily.    . Magnesium 250 MG TABS Take 250 mg by mouth daily.     . metoprolol succinate (TOPROL-XL) 100 MG 24 hr tablet Take 1 tablet (100 mg total) by mouth in the morning and at bedtime. Take with or immediately following a meal. 60 tablet 11  . Vitamin D, Cholecalciferol, 25 MCG (1000 UT) TABS Take 1,000 Units by mouth daily.      No current facility-administered medications for this visit.     Past Surgical History:  Procedure Laterality Date  . anterior cervical disectomy    . APPENDECTOMY    . CARDIOVERSION N/A 07/21/2019   Procedure: CARDIOVERSION;  Surgeon: Arnoldo Lenis, MD;  [facility];  Service: Endoscopy;  Laterality: N/A;  . CIRCUMCISION    . COLONOSCOPY  11/21/2011   MOD IH    . DG ARTHRO THUMB*L*     removal of cyst  . FLEXIBLE SIGMOIDOSCOPY N/A 04/12/2013   HEMRRHOID BANDING X3.   . HEMORRHOID BANDING N/A 04/12/2013   Procedure: HEMORRHOID BANDING;  Surgeon: Danie Binder, MD;  [facility];  Service: Endoscopy;  Laterality: N/A;  . NERVE, TENDON AND ARTERY REPAIR Left 02/21/2017   Procedure: WOUND EXPLORATION LEFT INDEX,  LONG, AND RING FINGERS;  NERVE, TENDON AND ARTERY REPAIR;  Surgeon: Leanora Cover, MD;  Location: [facility];  Service: Orthopedics;  Laterality: Left;     No Known Allergies    Family History  Problem Relation Age of Onset  . Liver disease Brother        ? etiology     Social History Mr. Demott reports that he has never smoked. He has never used smokeless tobacco. Mr. Chukwu reports current alcohol use.   Review of Systems CONSTITUTIONAL: No weight loss, fever, chills, weakness or fatigue.  HEENT: Eyes: No visual loss, blurred vision, double vision or yellow sclerae.No hearing loss, sneezing, congestion, runny nose or sore throat.  SKIN: No rash or itching.  CARDIOVASCULAR: per hpi RESPIRATORY: No shortness of breath, cough or sputum.  GASTROINTESTINAL: No  anorexia, nausea, vomiting or diarrhea. No abdominal pain or blood.  GENITOURINARY: No burning on urination, no polyuria NEUROLOGICAL: No headache, dizziness, syncope, paralysis, ataxia, numbness or tingling in the extremities. No change in bowel or bladder control.  MUSCULOSKELETAL: No muscle, back pain, joint pain or stiffness.  LYMPHATICS: No enlarged nodes. No history of splenectomy.  PSYCHIATRIC: No history of depression or anxiety.  ENDOCRINOLOGIC: No reports of sweating, cold or heat intolerance. No polyuria or polydipsia.  Marland Kitchen   Physical Examination Today's Vitals   07/26/19 1043  BP: 102/64  Pulse: 80  SpO2: 98%  Weight: 298 lb 12.8 oz (135.5 kg)  Height: 6\' 2"  (1.88 m)   Body mass index is 38.36 kg/m.  Gen: resting comfortably, no acute distress HEENT: no scleral icterus, pupils equal round and reactive, no palptable cervical adenopathy,  CV: RRR, no m/r/g no jvd Resp: Clear to auscultation bilaterally GI: abdomen is soft, non-tender, non-distended, normal bowel sounds, no hepatosplenomegaly MSK: extremities are warm, no edema.  Skin: warm, no rash Neuro:  no focal deficits Psych: appropriate affect   Diagnostic Studies   05/2013 Echo LVEF 60-65%, mild LVH, normal diastolic  0000000 Stress echo Stress results: Maximal heart rate during stress was 157bpm (97% of maximal predicted heart rate). The maximal predicted heart rate was 173bpm.The target heart rate was achieved. The heart rate response to stress was normal. There was a normal resting blood pressure with an appropriate response to stress. The rate-pressure product for the peak heart rate and blood pressure was 27978mm Hg/min. The patient experienced no chest pain during stress. Functional capacity was normal.  ------------------------------------------------------------ Baseline:  - The estimated LV ejection fraction was 55%. - Hypokinesis of the distallateral and apical LV myocardium. Immediate  post stress:  - LV global systolic function was vigorous. - Normal wall motion; no LV regional wall motion abnormalities.  02/2014 PFTs Mild obstruction consistent with mild asthma/COPD   Assessment and Plan  1. Persistent afib - failed DCCV, EKG today shows rate controlled afib - continue rate control at this time. If symptoms or issues with rate control reconsider rhythm stategy, at this time more complex given limitations on antiarrhythmics given his systolic HF. I don't see a strong reason to puruse a rhythm strategy at this time.   2. Chronic sysotlic HF - relatively new diagnosis, LVEF 30-35% - suspect tachy mediated given afib and previously uncontrolled rates - continue toprol, change losartan to entresto 49/51mg  bid - bmet/mg/tsh in 2 weeks  - would repeat echo in approx 3 months, if ongoing dysfunction consider cath at that time.   3. OSA screen - awaiting sleep study. If diagnosed CPAP may help  his arrhythmia and HF.    F/u 3 weeks  Arnoldo Lenis, M.D.

## 2019-08-13 ENCOUNTER — Ambulatory Visit: Payer: 59

## 2019-08-13 ENCOUNTER — Other Ambulatory Visit: Payer: Self-pay

## 2019-08-13 DIAGNOSIS — G4733 Obstructive sleep apnea (adult) (pediatric): Secondary | ICD-10-CM | POA: Diagnosis not present

## 2019-08-13 DIAGNOSIS — R0683 Snoring: Secondary | ICD-10-CM

## 2019-08-13 DIAGNOSIS — Z9189 Other specified personal risk factors, not elsewhere classified: Secondary | ICD-10-CM

## 2019-08-16 ENCOUNTER — Ambulatory Visit: Payer: 59 | Admitting: Cardiology

## 2019-08-16 NOTE — Progress Notes (Deleted)
Clinical Summary [name] is a 54 y.o.male seen today for follow up of the following medical problems.   1.Afib - 07/2018 2 week eventmonitor showed coarse afib vs aflutter with several RVR episodes, new diagnosis for patientat that time - he did not follow up. We had planned to recheck EKG in a few weeks after our last visit and plan for DCCV if he was still in afib -further delay in cardioversion attempt awaiting [facility]  - s/p DCCV attempt 5/19, failed to convert to SR.  - mild palpitations at times but infrequent   2. Chronic systolic HF - 03/3242 echo LVEF 30-35%, mod RV dysfunction - changed lopressor to toprol 100mg  bid.  - no LE edema. No significant SOB/DOE  - last visit we changed losartan to entresto 49/51mg  bid - labs showed   3. OSA screen - +snoring, +apnea, + daytime somonlence - evaluation previously held due to cost and insurance concerns - has upcoming sleep study   Past Medical History:  Diagnosis Date  . Anal fissure   . Anxiety   . Asthma    "mild" per patient. inhaler prn  . Cancer ([place])    right shoulder melanoma  . COPD (chronic obstructive pulmonary disease) ([place])    "mild" per patient, no smoking history, possibly due to working in a [employer]  . Depression   . GERD (gastroesophageal reflux disease)   . H/O hiatal hernia   . History of kidney stones   . Hypertension   . Obesity (BMI 30-39.9) AUG 2013 266 LBS   FEB 2015 284 LBS     No Known Allergies   Current Outpatient Medications  Medication Sig Dispense Refill  . acetaminophen (TYLENOL) 500 MG tablet Take 1,000 mg by mouth every 6 (six) hours as needed for moderate pain.     Marland Kitchen albuterol (PROAIR HFA) 108 (90 Base) MCG/ACT inhaler Inhale 1 puff into the lungs every 6 (six) hours as needed for wheezing or shortness of breath. 3.7 g 1  . apixaban (ELIQUIS) 5 MG TABS tablet Take 1 tablet (5 mg total) by mouth 2 (two) times daily. 180 tablet 2  . escitalopram  (LEXAPRO) 20 MG tablet Take 20 mg by mouth daily.    . Magnesium 250 MG TABS Take 250 mg by mouth daily.     . metoprolol succinate (TOPROL-XL) 100 MG 24 hr tablet Take 1 tablet (100 mg total) by mouth in the morning and at bedtime. Take with or immediately following a meal. 60 tablet 11  . sacubitril-valsartan (ENTRESTO) 49-51 MG Take 1 tablet by mouth 2 (two) times daily. 28 tablet 0  . Vitamin D, Cholecalciferol, 25 MCG (1000 UT) TABS Take 1,000 Units by mouth daily.      No current facility-administered medications for this visit.     Past Surgical History:  Procedure Laterality Date  . anterior cervical disectomy    . APPENDECTOMY    . CARDIOVERSION N/A 07/21/2019   Procedure: CARDIOVERSION;  Surgeon: Arnoldo Lenis, MD;  [facility];  Service: Endoscopy;  Laterality: N/A;  . CIRCUMCISION    . COLONOSCOPY  11/21/2011   MOD IH    . DG ARTHRO THUMB*L*     removal of cyst  . FLEXIBLE SIGMOIDOSCOPY N/A 04/12/2013   HEMRRHOID BANDING X3.   . HEMORRHOID BANDING N/A 04/12/2013   Procedure: HEMORRHOID BANDING;  Surgeon: Danie Binder, MD;  [facility];  Service: Endoscopy;  Laterality: N/A;  .  NERVE, TENDON AND ARTERY REPAIR Left 02/21/2017   Procedure: WOUND EXPLORATION LEFT INDEX, LONG, AND RING FINGERS;  NERVE, TENDON AND ARTERY REPAIR;  Surgeon: Leanora Cover, MD;  Location: [facility];  Service: Orthopedics;  Laterality: Left;     No Known Allergies    Family History  Problem Relation Age of Onset  . Liver disease Brother        ? etiology     Social History [name] reports that he has never smoked. He has never used smokeless tobacco. [name] reports current alcohol use.   Review of Systems CONSTITUTIONAL: No weight loss, fever, chills, weakness or fatigue.  HEENT: Eyes: No visual loss, blurred vision, double vision or yellow sclerae.No hearing loss, sneezing, congestion, runny nose or sore throat.  SKIN: No rash or  itching.  CARDIOVASCULAR:  RESPIRATORY: No shortness of breath, cough or sputum.  GASTROINTESTINAL: No anorexia, nausea, vomiting or diarrhea. No abdominal pain or blood.  GENITOURINARY: No burning on urination, no polyuria NEUROLOGICAL: No headache, dizziness, syncope, paralysis, ataxia, numbness or tingling in the extremities. No change in bowel or bladder control.  MUSCULOSKELETAL: No muscle, back pain, joint pain or stiffness.  LYMPHATICS: No enlarged nodes. No history of splenectomy.  PSYCHIATRIC: No history of depression or anxiety.  ENDOCRINOLOGIC: No reports of sweating, cold or heat intolerance. No polyuria or polydipsia.  Marland Kitchen   Physical Examination There were no vitals filed for this visit. There were no vitals filed for this visit.  Gen: resting comfortably, no acute distress HEENT: no scleral icterus, pupils equal round and reactive, no palptable cervical adenopathy,  CV Resp: Clear to auscultation bilaterally GI: abdomen is soft, non-tender, non-distended, normal bowel sounds, no hepatosplenomegaly MSK: extremities are warm, no edema.  Skin: warm, no rash Neuro:  no focal deficits Psych: appropriate affect   Diagnostic Studies  05/2013 Echo LVEF 60-65%, mild LVH, normal diastolic  09/2534 Stress echo Stress results: Maximal heart rate during stress was 157bpm (97% of maximal predicted heart rate). The maximal predicted heart rate was 173bpm.The target heart rate was achieved. The heart rate response to stress was normal. There was a normal resting blood pressure with an appropriate response to stress. The rate-pressure product for the peak heart rate and blood pressure was 27937mm Hg/min. The patient experienced no chest pain during stress. Functional capacity was normal.  ------------------------------------------------------------ Baseline:  - The estimated LV ejection fraction was 55%. - Hypokinesis of the distallateral and apical LV myocardium. Immediate  post stress:  - LV global systolic function was vigorous. - Normal wall motion; no LV regional wall motion abnormalities.  02/2014 PFTs Mild obstruction consistent with mild asthma/COPD   Assessment and Plan  1. Persistent afib - failed DCCV, EKG today shows rate controlled afib - continue rate control at this time. If symptoms or issues with rate control reconsider rhythm stategy, at this time more complex given limitations on antiarrhythmics given his systolic HF. I don't see a strong reason to puruse a rhythm strategy at this time.   2. Chronic sysotlic HF - relatively new diagnosis, LVEF 30-35% - suspect tachy mediated given afib and previously uncontrolled rates - continue toprol, change losartan to entresto 49/51mg  bid - bmet/mg/tsh in 2 weeks  - would repeat echo in approx 3 months, if ongoing dysfunction consider cath at that time.   3. OSA screen - awaiting sleep study. If diagnosed CPAP may help his arrhythmia and HF.        Arnoldo Lenis,  M.D., F.A.C.C.

## 2019-08-18 ENCOUNTER — Other Ambulatory Visit (HOSPITAL_COMMUNITY)
Admission: RE | Admit: 2019-08-18 | Discharge: 2019-08-18 | Disposition: A | Discharge status: Home / Self Care | Payer: 59 | Source: Ambulatory Visit | Attending: Cardiology | Admitting: Cardiology

## 2019-08-18 DIAGNOSIS — I4819 Other persistent atrial fibrillation: Secondary | ICD-10-CM | POA: Insufficient documentation

## 2019-08-18 LAB — BASIC METABOLIC PANEL
Anion gap: 4 — ABNORMAL LOW (ref 5–15)
BUN: 16 mg/dL (ref 6–20)
CO2: 31 mmol/L (ref 22–32)
Calcium: 9.2 mg/dL (ref 8.9–10.3)
Chloride: 107 mmol/L (ref 98–111)
Creatinine, Ser: 1.04 mg/dL (ref 0.61–1.24)
GFR calc Af Amer: >60 mL/min (ref >60)
GFR calc non Af Amer: >60 mL/min (ref >60)
Glucose, Bld: 105 mg/dL — ABNORMAL HIGH (ref 70–99)
Potassium: 4.1 mmol/L (ref 3.5–5.1)
Sodium: 142 mmol/L (ref 135–145)

## 2019-08-18 LAB — TSH: TSH: 1.774 u[IU]/mL (ref 0.350–4.500)

## 2019-08-18 LAB — MAGNESIUM: Magnesium: 2.1 mg/dL (ref 1.7–2.4)

## 2019-08-24 ENCOUNTER — Telehealth: Payer: Self-pay | Admitting: *Deleted

## 2019-08-24 ENCOUNTER — Telehealth: Payer: Self-pay | Admitting: Pulmonary Disease

## 2019-08-24 NOTE — Telephone Encounter (Signed)
HST showed moderate OSA-  AHI 28/h AutoCPAP 5-20 cm, mask of choice  OV with APP in 6 wks

## 2019-08-24 NOTE — Telephone Encounter (Signed)
-----   Message from Arnoldo Lenis, MD sent at 08/24/2019 10:29 AM EDT ----- Labs look good   Zandra Abts MD

## 2019-08-24 NOTE — Telephone Encounter (Signed)
ATC patient.  LMTCB. 

## 2019-08-24 NOTE — Telephone Encounter (Signed)
Pt aware - routed to pcp  

## 2019-08-31 ENCOUNTER — Encounter: Payer: Self-pay | Admitting: Cardiology

## 2019-08-31 ENCOUNTER — Ambulatory Visit: Payer: 59 | Admitting: Cardiology

## 2019-08-31 ENCOUNTER — Other Ambulatory Visit: Payer: Self-pay

## 2019-08-31 VITALS — BP 110/78 | HR 85 | Ht 74.0 in | Wt 296.0 lb

## 2019-08-31 DIAGNOSIS — I5022 Chronic systolic (congestive) heart failure: Secondary | ICD-10-CM

## 2019-08-31 DIAGNOSIS — G4733 Obstructive sleep apnea (adult) (pediatric): Secondary | ICD-10-CM

## 2019-08-31 DIAGNOSIS — I4819 Other persistent atrial fibrillation: Secondary | ICD-10-CM | POA: Diagnosis not present

## 2019-08-31 DIAGNOSIS — Z79899 Other long term (current) drug therapy: Secondary | ICD-10-CM

## 2019-08-31 DIAGNOSIS — K625 Hemorrhage of anus and rectum: Secondary | ICD-10-CM | POA: Diagnosis not present

## 2019-08-31 MED ORDER — SPIRONOLACTONE 25 MG PO TABS
12.5000 mg | ORAL_TABLET | Freq: Every day | ORAL | 3 refills | Status: DC
Start: 2019-08-31 — End: 2019-10-04

## 2019-08-31 NOTE — Progress Notes (Signed)
Clinical Summary [name] is a 54 y.o.male seen today for follow up of the following medical problems.   1.Afib - 07/2018 2 week eventmonitor showed coarse afib vs aflutter with several RVR episodes, new diagnosis for patientat that time - he did not follow up. We had planned to recheck EKG in a few weeks after our last visit and plan for DCCV if he was still in afib -further delay in cardioversion attempt awaiting [facility]  - s/p DCCV attempt 5/19, failed to convert to SR. Focusing on rate control strategy   - rare palpitations at times.  - no bleeding on eliquis.     2. Chronic systolic HF - 08/5463 echo LVEF 30-35%, mod RV dysfunction  - no recent symptoms - compliant with meds   3. OSA - followed by Dr Elsworth Soho, moderate OSA. Awaiting cpap  Past Medical History:  Diagnosis Date  . Anal fissure   . Anxiety   . Asthma    "mild" per patient. inhaler prn  . Cancer ([place])    right shoulder melanoma  . COPD (chronic obstructive pulmonary disease) ([place])    "mild" per patient, no smoking history, possibly due to working in a [employer]  . Depression   . GERD (gastroesophageal reflux disease)   . H/O hiatal hernia   . History of kidney stones   . Hypertension   . Obesity (BMI 30-39.9) AUG 2013 266 LBS   FEB 2015 284 LBS     No Known Allergies   Current Outpatient Medications  Medication Sig Dispense Refill  . acetaminophen (TYLENOL) 500 MG tablet Take 1,000 mg by mouth every 6 (six) hours as needed for moderate pain.     Marland Kitchen albuterol (PROAIR HFA) 108 (90 Base) MCG/ACT inhaler Inhale 1 puff into the lungs every 6 (six) hours as needed for wheezing or shortness of breath. 3.7 g 1  . apixaban (ELIQUIS) 5 MG TABS tablet Take 1 tablet (5 mg total) by mouth 2 (two) times daily. 180 tablet 2  . escitalopram (LEXAPRO) 20 MG tablet Take 20 mg by mouth daily.    . Magnesium 250 MG TABS Take 250 mg by mouth daily.     . metoprolol succinate (TOPROL-XL) 100  MG 24 hr tablet Take 1 tablet (100 mg total) by mouth in the morning and at bedtime. Take with or immediately following a meal. 60 tablet 11  . sacubitril-valsartan (ENTRESTO) 49-51 MG Take 1 tablet by mouth 2 (two) times daily. 28 tablet 0  . Vitamin D, Cholecalciferol, 25 MCG (1000 UT) TABS Take 1,000 Units by mouth daily.      No current facility-administered medications for this visit.     Past Surgical History:  Procedure Laterality Date  . anterior cervical disectomy    . APPENDECTOMY    . CARDIOVERSION N/A 07/21/2019   Procedure: CARDIOVERSION;  Surgeon: Arnoldo Lenis, MD;  [facility];  Service: Endoscopy;  Laterality: N/A;  . CIRCUMCISION    . COLONOSCOPY  11/21/2011   MOD IH    . DG ARTHRO THUMB*L*     removal of cyst  . FLEXIBLE SIGMOIDOSCOPY N/A 04/12/2013   HEMRRHOID BANDING X3.   . HEMORRHOID BANDING N/A 04/12/2013   Procedure: HEMORRHOID BANDING;  Surgeon: Danie Binder, MD;  [facility];  Service: Endoscopy;  Laterality: N/A;  . NERVE, TENDON AND ARTERY REPAIR Left 02/21/2017   Procedure: WOUND EXPLORATION LEFT INDEX, LONG, AND RING FINGERS;  NERVE, TENDON  AND ARTERY REPAIR;  Surgeon: Leanora Cover, MD;  Location: [facility];  Service: Orthopedics;  Laterality: Left;     No Known Allergies    Family History  Problem Relation Age of Onset  . Liver disease Brother        ? etiology     Social History [name] reports that he has never smoked. He has never used smokeless tobacco. [name] reports current alcohol use.   Review of Systems CONSTITUTIONAL: No weight loss, fever, chills, weakness or fatigue.  HEENT: Eyes: No visual loss, blurred vision, double vision or yellow sclerae.No hearing loss, sneezing, congestion, runny nose or sore throat.  SKIN: No rash or itching.  CARDIOVASCULAR: per hpi RESPIRATORY: No shortness of breath, cough or sputum.  GASTROINTESTINAL: No anorexia, nausea, vomiting or diarrhea.  No abdominal pain or blood.  GENITOURINARY: No burning on urination, no polyuria NEUROLOGICAL: No headache, dizziness, syncope, paralysis, ataxia, numbness or tingling in the extremities. No change in bowel or bladder control.  MUSCULOSKELETAL: No muscle, back pain, joint pain or stiffness.  LYMPHATICS: No enlarged nodes. No history of splenectomy.  PSYCHIATRIC: No history of depression or anxiety.  ENDOCRINOLOGIC: No reports of sweating, cold or heat intolerance. No polyuria or polydipsia.  Marland Kitchen   Physical Examination Today's Vitals   08/31/19 1309  BP: 110/78  Pulse: 85  SpO2: 97%  Weight: 296 lb (134.3 kg)  Height: 6\' 2"  (1.88 m)   Body mass index is 38 kg/m.  Gen: resting comfortably, no acute distress HEENT: no scleral icterus, pupils equal round and reactive, no palptable cervical adenopathy,  CV: RRR, no m/r/g, no jvd Resp: Clear to auscultation bilaterally GI: abdomen is soft, non-tender, non-distended, normal bowel sounds, no hepatosplenomegaly MSK: extremities are warm, no edema.  Skin: warm, no rash Neuro:  no focal deficits Psych: appropriate affect   Diagnostic Studies 05/2013 Echo LVEF 60-65%, mild LVH, normal diastolic  03/4237 Stress echo Stress results: Maximal heart rate during stress was 157bpm (97% of maximal predicted heart rate). The maximal predicted heart rate was 173bpm.The target heart rate was achieved. The heart rate response to stress was normal. There was a normal resting blood pressure with an appropriate response to stress. The rate-pressure product for the peak heart rate and blood pressure was 27928mm Hg/min. The patient experienced no chest pain during stress. Functional capacity was normal.  ------------------------------------------------------------ Baseline:  - The estimated LV ejection fraction was 55%. - Hypokinesis of the distallateral and apical LV myocardium. Immediate post stress:  - LV global systolic function was  vigorous. - Normal wall motion; no LV regional wall motion abnormalities.  02/2014 PFTs Mild obstruction consistent with mild asthma/COPD     Assessment and Plan   1. Persistent afib - failed DCCV - continue rate control at this time. - no recent symptoms, continue current meds  2. Chronic sysotlic HF - relatively new diagnosis, LVEF 30-35% - suspect tachy mediated given afib and previously uncontrolled rates - we will start aldactone 12.5mg  daily, check BMET in 2 weeks  - would repeat echo in approx 3 months, if ongoing dysfunction consider cath at that time.    3. Rectal bleeding - refer to GI - will add cbc to upcoming labs - intermittent, for now continue anticoagulation    Arnoldo Lenis, M.D..

## 2019-08-31 NOTE — Patient Instructions (Signed)
Medication Instructions:  Your physician has recommended you make the following change in your medication:  Start Aldactone 12.5 mg Daily   *If you need a refill on your cardiac medications before your next appointment, please call your pharmacy*   Lab Work: Your physician recommends that you return for lab work in: 2 Weeks   If you have labs (blood work) drawn today and your tests are completely normal, you will receive your results only by: Marland Kitchen MyChart Message (if you have MyChart) OR . A paper copy in the mail If you have any lab test that is abnormal or we need to change your treatment, we will call you to review the results.   Testing/Procedures: NONE   Follow-Up: At [facility], you and your health needs are our priority.  As part of our continuing mission to provide you with exceptional heart care, we have created designated Provider Care Teams.  These Care Teams include your primary Cardiologist (physician) and Advanced Practice Providers (APPs -  Physician Assistants and Nurse Practitioners) who all work together to provide you with the care you need, when you need it.  We recommend signing up for the patient portal called "MyChart".  Sign up information is provided on this After Visit Summary.  MyChart is used to connect with patients for Virtual Visits (Telemedicine).  Patients are able to view lab/test results, encounter notes, upcoming appointments, etc.  Non-urgent messages can be sent to your provider as well.   To learn more about what you can do with MyChart, go to [email or web address].    Your next appointment:   3 week(s)  The format for your next appointment:   In Person  Provider:   You may see Carlyle Dolly, MD or one of the following Advanced Practice Providers on your designated Care Team:    Bernerd Pho, PA-C   Ermalinda Barrios, [place]     Other Instructions Thank you for choosing [facility]!

## 2019-09-01 ENCOUNTER — Encounter: Payer: Self-pay | Admitting: Internal Medicine

## 2019-09-01 NOTE — Telephone Encounter (Signed)
ATC patient X2 LMTCB 

## 2019-09-03 ENCOUNTER — Telehealth: Payer: Self-pay | Admitting: Pulmonary Disease

## 2019-09-03 DIAGNOSIS — G4733 Obstructive sleep apnea (adult) (pediatric): Secondary | ICD-10-CM

## 2019-09-03 NOTE — Telephone Encounter (Signed)
Patient contacted with results of home sleep study. Patient is willing to start CPAP therapy, DME order placed per Dr. Bari Mantis recommendations. Follow up recall made with Dr. Elsworth Soho in 2 months.

## 2019-09-03 NOTE — Telephone Encounter (Signed)
ATC patient X3 LMTCB. Per protocol I will close this encounter. Nothing further needed at this time

## 2019-09-27 NOTE — Progress Notes (Signed)
Cardiology Office Note    Date:  10/04/2019   ID:  [name], DOB 09-Feb-1966, MRN [id number]  PCP:  Sharilyn Sites, MD  Cardiologist: Carlyle Dolly, MD EPS: None  Chief Complaint  Patient presents with  . Follow-up    History of Present Illness:  [name] is a 54 y.o. male PAF with failed cardioversion 5/19 plan for rate control, chronic systolic CHF EF 30 to 93% with moderate RV dysfunction 06/2019, OSA awaiting CPAP.  Patient last saw Dr. Harl Bowie 08/31/2019 at which time he felt his heart failure was tachycardia mediated given A. fib with uncontrolled rates.  He started Aldactone 12.5 mg daily mended repeat echo in 3 months.  If ongoing dysfunction consider cath.  He also had a rectal bleed and was referred to GI.  Patient had chest soreness and a headache most of the day yesterday. Was doing heavy lifting at work. Became fatigued when playing golf in the heat of the day. Hasn't had time to be fitted for CPAP. Rectal bleeding has improved and hasn't scheduled to see GI yet. Says he saw urologist and doing better.Works nights and doesn't have time. Drinks 4-5 20 ounce caffeinated sodas daily.  Past Medical History:  Diagnosis Date  . Anal fissure   . Anxiety   . Asthma    "mild" per patient. inhaler prn  . Cancer ([place])    right shoulder melanoma  . COPD (chronic obstructive pulmonary disease) ([place])    "mild" per patient, no smoking history, possibly due to working in a [employer]  . Depression   . GERD (gastroesophageal reflux disease)   . H/O hiatal hernia   . History of kidney stones   . Hypertension   . Obesity (BMI 30-39.9) AUG 2013 266 LBS   FEB 2015 284 LBS    Past Surgical History:  Procedure Laterality Date  . anterior cervical disectomy    . APPENDECTOMY    . CARDIOVERSION N/A 07/21/2019   Procedure: CARDIOVERSION;  Surgeon: Arnoldo Lenis, MD;  [facility];  Service: Endoscopy;  Laterality: N/A;  . CIRCUMCISION    . COLONOSCOPY   11/21/2011   MOD IH    . DG ARTHRO THUMB*L*     removal of cyst  . FLEXIBLE SIGMOIDOSCOPY N/A 04/12/2013   HEMRRHOID BANDING X3.   . HEMORRHOID BANDING N/A 04/12/2013   Procedure: HEMORRHOID BANDING;  Surgeon: Danie Binder, MD;  [facility];  Service: Endoscopy;  Laterality: N/A;  . NERVE, TENDON AND ARTERY REPAIR Left 02/21/2017   Procedure: WOUND EXPLORATION LEFT INDEX, LONG, AND RING FINGERS;  NERVE, TENDON AND ARTERY REPAIR;  Surgeon: Leanora Cover, MD;  Location: [facility];  Service: Orthopedics;  Laterality: Left;    Current Medications: Current Meds  Medication Sig  . acetaminophen (TYLENOL) 500 MG tablet Take 1,000 mg by mouth every 6 (six) hours as needed for moderate pain.   Marland Kitchen albuterol (PROAIR HFA) 108 (90 Base) MCG/ACT inhaler Inhale 1 puff into the lungs every 6 (six) hours as needed for wheezing or shortness of breath.  Marland Kitchen apixaban (ELIQUIS) 5 MG TABS tablet Take 1 tablet (5 mg total) by mouth 2 (two) times daily.  Marland Kitchen escitalopram (LEXAPRO) 20 MG tablet Take 20 mg by mouth daily.  . Magnesium 250 MG TABS Take 250 mg by mouth daily.   . metoprolol succinate (TOPROL-XL) 100 MG 24 hr tablet Take 1 tablet (100 mg total) by mouth in the morning and  at bedtime. Take with or immediately following a meal.  . Omega-3 Fatty Acids (FISH OIL) 1000 MG CAPS Take by mouth.  . sacubitril-valsartan (ENTRESTO) 49-51 MG Take 1 tablet by mouth 2 (two) times daily.  Marland Kitchen spironolactone (ALDACTONE) 25 MG tablet Take 0.5 tablets (12.5 mg total) by mouth daily.  . Vitamin D, Cholecalciferol, 25 MCG (1000 UT) TABS Take 1,000 Units by mouth daily.      Allergies:   Patient has no known allergies.   Social History   Socioeconomic History  . Marital status: Married    Spouse name: Not on file  . Number of children: 2  . Years of education: Not on file  . Highest education level: Not on file  Occupational History  . Occupation: Doctor, hospital  Tobacco Use  . Smoking status:  Never Smoker  . Smokeless tobacco: Never Used  Vaping Use  . Vaping Use: Never used  Substance and Sexual Activity  . Alcohol use: Not Currently    Alcohol/week: 0.0 standard drinks    Comment: socially  . Drug use: No  . Sexual activity: Yes    Birth control/protection: None  Other Topics Concern  . Not on file  Social History Narrative  . Not on file   Social Determinants of Health   Financial Resource Strain:   . Difficulty of Paying Living Expenses:   Food Insecurity:   . Worried About Charity fundraiser in the Last Year:   . Arboriculturist in the Last Year:   Transportation Needs:   . Film/video editor (Medical):   Marland Kitchen Lack of Transportation (Non-Medical):   Physical Activity:   . Days of Exercise per Week:   . Minutes of Exercise per Session:   Stress:   . Feeling of Stress :   Social Connections:   . Frequency of Communication with Friends and Family:   . Frequency of Social Gatherings with Friends and Family:   . Attends Religious Services:   . Active Member of Clubs or Organizations:   . Attends Archivist Meetings:   Marland Kitchen Marital Status:      Family History:  The patient's family history includes Liver disease in his brother.   ROS:   Please see the history of present illness.    ROS All other systems reviewed and are negative.   PHYSICAL EXAM:   VS:  BP 132/80 (BP Location: Left Arm, Patient Position: Sitting, Cuff Size: Large)   Pulse 61   Ht 6\' 2"  (1.88 m)   Wt 298 lb 3.2 oz (135.3 kg)   SpO2 97% Comment: at rest  BMI 38.29 kg/m   Physical Exam  GEN: Obese, in no acute distress  Neck: no JVD, carotid bruits, or masses Cardiac:irreg irreg; no murmurs, rubs, or gallops  Respiratory:  clear to auscultation bilaterally, normal work of breathing GI: soft, nontender, nondistended, + BS Ext: without cyanosis, clubbing, or edema, Good distal pulses bilaterally Neuro:  Alert and Oriented x 3 Psych: euthymic mood, full affect  Wt  Readings from Last 3 Encounters:  10/04/19 298 lb 3.2 oz (135.3 kg)  08/31/19 296 lb (134.3 kg)  07/26/19 298 lb 12.8 oz (135.5 kg)      Studies/Labs Reviewed:   EKG:  EKG is not ordered today.   Recent Labs: 08/18/2019: BUN 16; Creatinine, Ser 1.04; Magnesium 2.1; Potassium 4.1; Sodium 142; TSH 1.774   Lipid Panel No results found for: CHOL, TRIG, HDL, CHOLHDL, VLDL, LDLCALC, LDLDIRECT  Additional  studies/ records that were reviewed today include:  IMPRESSIONS     1. Left ventricular ejection fraction, by estimation, is 30 to 35%. The  left ventricle has moderately decreased function. The left ventricle  demonstrates global hypokinesis. The left ventricular internal cavity size  was mildly dilated. There is mild  left ventricular hypertrophy. Left ventricular diastolic parameters are  indeterminate in the setting of atrial fibrillation.   2. Right ventricular systolic function is moderately reduced. The right  ventricular size is moderately enlarged. Tricuspid regurgitation signal is  inadequate for assessing PA pressure.   3. Left atrial size was mild to moderately dilated.   4. Right atrial size was moderately dilated.   5. The mitral valve is grossly normal. Trivial mitral valve  regurgitation.   6. The aortic valve is tricuspid. Aortic valve regurgitation is not  visualized.   7. The inferior vena cava is normal in size with greater than 50%  respiratory variability, suggesting right atrial pressure of 3 mmHg.   FINDINGS   Left Ventricle: Left ventricular ejection fraction, by estimation, is 30  to 35%. The left ventricle has moderately decreased function. The left  ventricle demonstrates global hypokinesis. The left ventricular internal  cavity size was mildly dilated.  There is mild left ventricular hypertrophy. Left ventricular diastolic  parameters are indeterminate.   Right Ventricle: The right ventricular size is moderately enlarged. No  increase in right  ventricular wall thickness. Right ventricular systolic  function is moderately reduced. Tricuspid regurgitation signal is  inadequate for assessing PA pressure.   Left Atrium: Left atrial size was mild to moderately dilated.   Right Atrium: Right atrial size was moderately dilated.   Pericardium: There is no evidence of pericardial effusion. Presence of  pericardial fat pad.   Mitral Valve: The mitral valve is grossly normal. Mild mitral annular  calcification. Trivial mitral valve regurgitation.   Tricuspid Valve: The tricuspid valve is grossly normal. Tricuspid valve  regurgitation is trivial.   Aortic Valve: The aortic valve is tricuspid. Aortic valve regurgitation is  not visualized.   Pulmonic Valve: The pulmonic valve was not well visualized. Pulmonic valve  regurgitation is trivial.   Aorta: The aortic root is normal in size and structure.   Venous: The inferior vena cava is normal in size with greater than 50%  respiratory variability, suggesting right atrial pressure of 3 mmHg.   IAS/Shunts: No atrial level shunt detected by color flow Doppler.         ASSESSMENT:    1. Paroxysmal atrial fibrillation (HCC)   2. NICM (nonischemic cardiomyopathy) ([place])   3. Chronic systolic CHF (congestive heart failure) ([place])   4. OSA (obstructive sleep apnea)   5. Rectal bleed   6. Educated about COVID-19 virus infection      PLAN:  In order of problems listed above:  PAF with failed cardioversion in the past recommend rate control-on Toprol XL 100 mg daily and eliquis. Recommend he decrease caffeine intake  Cardiomyopathy question tachycardia mediated given A. fib with uncontrolled rates in the past started on Aldactone-no CHF on exam. Increase aldactone if renal ok, continue entresto. F/u echo at the end of Sept.  Chronic systolic CHF EF 30 to 11% with moderate RV dysfunction on echo 4/2021on echo  OSA awaiting CPAP- I've asked him to schedule to be fitted  Rectal  bleeding-improved since he's not straining as much. Will check CBC  Covid 19 education-hasn't been vaccinated and doesn't plan on it. Long discussion about his  risk of serious illness if he gets covid19    Medication Adjustments/Labs and Tests Ordered: Current medicines are reviewed at length with the patient today.  Concerns regarding medicines are outlined above.  Medication changes, Labs and Tests ordered today are listed in the Patient Instructions below. Patient Instructions  Medication Instructions:  Your physician recommends that you continue on your current medications as directed. Please refer to the Current Medication list given to you today.   DECREASE CAFFEINE INTAKE  *If you need a refill on your cardiac medications before your next appointment, please call your pharmacy*   Lab Work: CBC,BMET today  If you have labs (blood work) drawn today and your tests are completely normal, you will receive your results only by: Marland Kitchen MyChart Message (if you have MyChart) OR . A paper copy in the mail If you have any lab test that is abnormal or we need to change your treatment, we will call you to review the results.   Testing/Procedures: Your physician has requested that you have an echocardiogram (THE END OF September) . Echocardiography is a painless test that uses sound waves to create images of your heart. It provides your doctor with information about the size and shape of your heart and how well your heart's chambers and valves are working. This procedure takes approximately one hour. There are no restrictions for this procedure.     Follow-Up: At [facility], you and your health needs are our priority.  As part of our continuing mission to provide you with exceptional heart care, we have created designated Provider Care Teams.  These Care Teams include your primary Cardiologist (physician) and Advanced Practice Providers (APPs -  Physician Assistants and Nurse Practitioners)  who all work together to provide you with the care you need, when you need it.  We recommend signing up for the patient portal called "MyChart".  Sign up information is provided on this After Visit Summary.  MyChart is used to connect with patients for Virtual Visits (Telemedicine).  Patients are able to view lab/test results, encounter notes, upcoming appointments, etc.  Non-urgent messages can be sent to your provider as well.   To learn more about what you can do with MyChart, go to [email or web address].    Your next appointment:   3 month(s)  The format for your next appointment:   In Person  Provider:   Carlyle Dolly, MD   Other Instructions Go to any local pharmacy to get  COVID shot, [facility] [phone] on [address], [place] on [place] [phone]     Signed, Hulda Humphrey  10/04/2019 12:09 PM    [address], [place], [place]  [address] Phone: [phone]; Fax: [phone]

## 2019-10-04 ENCOUNTER — Encounter: Payer: Self-pay | Admitting: Physician Assistant

## 2019-10-04 ENCOUNTER — Other Ambulatory Visit: Payer: Self-pay

## 2019-10-04 ENCOUNTER — Ambulatory Visit: Payer: 59 | Admitting: Physician Assistant

## 2019-10-04 ENCOUNTER — Other Ambulatory Visit (HOSPITAL_COMMUNITY)
Admission: RE | Admit: 2019-10-04 | Discharge: 2019-10-04 | Disposition: A | Discharge status: Home / Self Care | Payer: 59 | Source: Ambulatory Visit | Attending: Physician Assistant | Admitting: Physician Assistant

## 2019-10-04 ENCOUNTER — Telehealth: Payer: Self-pay

## 2019-10-04 VITALS — BP 132/80 | HR 61 | Ht 74.0 in | Wt 298.2 lb

## 2019-10-04 DIAGNOSIS — I5022 Chronic systolic (congestive) heart failure: Secondary | ICD-10-CM | POA: Insufficient documentation

## 2019-10-04 DIAGNOSIS — I480 Paroxysmal atrial fibrillation: Secondary | ICD-10-CM | POA: Diagnosis not present

## 2019-10-04 DIAGNOSIS — Z7189 Other specified counseling: Secondary | ICD-10-CM

## 2019-10-04 DIAGNOSIS — I428 Other cardiomyopathies: Secondary | ICD-10-CM | POA: Insufficient documentation

## 2019-10-04 DIAGNOSIS — G4733 Obstructive sleep apnea (adult) (pediatric): Secondary | ICD-10-CM | POA: Diagnosis not present

## 2019-10-04 DIAGNOSIS — K625 Hemorrhage of anus and rectum: Secondary | ICD-10-CM

## 2019-10-04 LAB — BASIC METABOLIC PANEL
Anion gap: 6 (ref 5–15)
BUN: 17 mg/dL (ref 6–20)
CO2: 26 mmol/L (ref 22–32)
Calcium: 8.8 mg/dL — ABNORMAL LOW (ref 8.9–10.3)
Chloride: 106 mmol/L (ref 98–111)
Creatinine, Ser: 0.89 mg/dL (ref 0.61–1.24)
GFR calc Af Amer: >60 mL/min (ref >60)
GFR calc non Af Amer: >60 mL/min (ref >60)
Glucose, Bld: 99 mg/dL (ref 70–99)
Potassium: 4 mmol/L (ref 3.5–5.1)
Sodium: 138 mmol/L (ref 135–145)

## 2019-10-04 LAB — CBC
HCT: 42.8 % (ref 39.0–52.0)
Hemoglobin: 14 g/dL (ref 13.0–17.0)
MCH: 29.7 pg (ref 26.0–34.0)
MCHC: 32.7 g/dL (ref 30.0–36.0)
MCV: 90.9 fL (ref 80.0–100.0)
Platelets: 194 10*3/uL (ref 150–400)
RBC: 4.71 MIL/uL (ref 4.22–5.81)
RDW: 12.1 % (ref 11.5–15.5)
WBC: 5.2 10*3/uL (ref 4.0–10.5)
nRBC: 0 % (ref 0.0–0.2)

## 2019-10-04 MED ORDER — SPIRONOLACTONE 25 MG PO TABS
25.0000 mg | ORAL_TABLET | Freq: Every day | ORAL | 3 refills | Status: DC
Start: 2019-10-04 — End: 2020-10-16

## 2019-10-04 NOTE — Telephone Encounter (Signed)
-----   Message from Imogene Burn, PA-C sent at 10/04/2019  3:53 PM EDT ----- Labs stable, increase spironolactone 25 mg once daily. Calcium slightly low. Can take a multivitamin

## 2019-10-04 NOTE — Patient Instructions (Addendum)
Medication Instructions:  Your physician recommends that you continue on your current medications as directed. Please refer to the Current Medication list given to you today.   DECREASE CAFFEINE INTAKE  *If you need a refill on your cardiac medications before your next appointment, please call your pharmacy*   Lab Work: CBC,BMET today  If you have labs (blood work) drawn today and your tests are completely normal, you will receive your results only by: Marland Kitchen MyChart Message (if you have MyChart) OR . A paper copy in the mail If you have any lab test that is abnormal or we need to change your treatment, we will call you to review the results.   Testing/Procedures: Your physician has requested that you have an echocardiogram (THE END OF September) . Echocardiography is a painless test that uses sound waves to create images of your heart. It provides your doctor with information about the size and shape of your heart and how well your heart's chambers and valves are working. This procedure takes approximately one hour. There are no restrictions for this procedure.     Follow-Up: At [facility], you and your health needs are our priority.  As part of our continuing mission to provide you with exceptional heart care, we have created designated Provider Care Teams.  These Care Teams include your primary Cardiologist (physician) and Advanced Practice Providers (APPs -  Physician Assistants and Nurse Practitioners) who all work together to provide you with the care you need, when you need it.  We recommend signing up for the patient portal called "MyChart".  Sign up information is provided on this After Visit Summary.  MyChart is used to connect with patients for Virtual Visits (Telemedicine).  Patients are able to view lab/test results, encounter notes, upcoming appointments, etc.  Non-urgent messages can be sent to your provider as well.   To learn more about what you can do with MyChart, go to  [email or web address].    Your next appointment:   3 month(s)  The format for your next appointment:   In Person  Provider:   Carlyle Dolly, MD   Other Instructions Go to any local pharmacy to get  [place] shot, [facility] [phone] on [address] [place] on [place] [phone]

## 2019-10-04 NOTE — Telephone Encounter (Signed)
I spoke with patient.He will increase spironolactone to 25 mg daily and start a multi-vitamin, labs copied to pcp

## 2019-11-01 ENCOUNTER — Ambulatory Visit: Payer: 59 | Admitting: Gastroenterology

## 2019-11-01 ENCOUNTER — Encounter: Payer: Self-pay | Admitting: *Deleted

## 2019-11-01 NOTE — Progress Notes (Deleted)
Primary Care Physician:  Sharilyn Sites, MD Referring provider: Dr. Carlyle Dolly Primary Gastroenterologist:  Elon Alas. Abbey Chatters, DO   No chief complaint on file.   HPI:  [name] is a 54 y.o. male with history of paroxysmal atrial fibrillation with failed cardioversion May 2021, nonischemic cardiomyopathy, chronic systolic CHF with a EF of 30 to 35% with moderate RV dysfunction April 2021 (scheduled for repeat echo in September), obstructive sleep apnea with pending CPAP here at the request of Dr. Harl Bowie for further evaluation of rectal bleeding.  CBC earlier this month with normal hemoglobin.  Patient had a complete colonoscopy in October 2013 and noted to have moderate internal hemorrhoids.  Subsequently underwent flexible sigmoidoscopy with hemorrhoid banding in February 2015.  He did not appreciate significant improvement in his hemorrhoid symptoms.  Current Outpatient Medications  Medication Sig Dispense Refill  . acetaminophen (TYLENOL) 500 MG tablet Take 1,000 mg by mouth every 6 (six) hours as needed for moderate pain.     Marland Kitchen albuterol (PROAIR HFA) 108 (90 Base) MCG/ACT inhaler Inhale 1 puff into the lungs every 6 (six) hours as needed for wheezing or shortness of breath. 3.7 g 1  . apixaban (ELIQUIS) 5 MG TABS tablet Take 1 tablet (5 mg total) by mouth 2 (two) times daily. 180 tablet 2  . escitalopram (LEXAPRO) 20 MG tablet Take 20 mg by mouth daily.    . Magnesium 250 MG TABS Take 250 mg by mouth daily.     . metoprolol succinate (TOPROL-XL) 100 MG 24 hr tablet Take 1 tablet (100 mg total) by mouth in the morning and at bedtime. Take with or immediately following a meal. 60 tablet 11  . Omega-3 Fatty Acids (FISH OIL) 1000 MG CAPS Take by mouth.    . sacubitril-valsartan (ENTRESTO) 49-51 MG Take 1 tablet by mouth 2 (two) times daily. 28 tablet 0  . spironolactone (ALDACTONE) 25 MG tablet Take 1 tablet (25 mg total) by mouth daily. 90 tablet 3  . Vitamin D, Cholecalciferol, 25  MCG (1000 UT) TABS Take 1,000 Units by mouth daily.      No current facility-administered medications for this visit.    Allergies as of 11/01/2019  . (No Known Allergies)    Past Medical History:  Diagnosis Date  . Anal fissure   . Anxiety   . Asthma    "mild" per patient. inhaler prn  . Cancer ([place])    right shoulder melanoma  . COPD (chronic obstructive pulmonary disease) ([place])    "mild" per patient, no smoking history, possibly due to working in a [employer]  . Depression   . GERD (gastroesophageal reflux disease)   . H/O hiatal hernia   . History of kidney stones   . Hypertension   . Obesity (BMI 30-39.9) AUG 2013 266 LBS   FEB 2015 284 LBS    Past Surgical History:  Procedure Laterality Date  . anterior cervical disectomy    . APPENDECTOMY    . CARDIOVERSION N/A 07/21/2019   Procedure: CARDIOVERSION;  Surgeon: Arnoldo Lenis, MD;  [facility];  Service: Endoscopy;  Laterality: N/A;  . CIRCUMCISION    . COLONOSCOPY  11/21/2011   MOD IH    . DG ARTHRO THUMB*L*     removal of cyst  . FLEXIBLE SIGMOIDOSCOPY N/A 04/12/2013   HEMRRHOID BANDING X3.   . HEMORRHOID BANDING N/A 04/12/2013   Procedure: HEMORRHOID BANDING;  Surgeon: Danie Binder, MD;  [facility];  Service: Endoscopy;  Laterality: N/A;  . NERVE, TENDON AND ARTERY REPAIR Left 02/21/2017   Procedure: WOUND EXPLORATION LEFT INDEX, LONG, AND RING FINGERS;  NERVE, TENDON AND ARTERY REPAIR;  Surgeon: Leanora Cover, MD;  Location: [place];  Service: Orthopedics;  Laterality: Left;    Family History  Problem Relation Age of Onset  . Liver disease Brother        ? etiology    Social History   Socioeconomic History  . Marital status: Married    Spouse name: Not on file  . Number of children: 2  . Years of education: Not on file  . Highest education level: Not on file  Occupational History  . Occupation: Doctor, hospital  Tobacco Use  . Smoking status: Never Smoker   . Smokeless tobacco: Never Used  Vaping Use  . Vaping Use: Never used  Substance and Sexual Activity  . Alcohol use: Not Currently    Alcohol/week: 0.0 standard drinks    Comment: socially  . Drug use: No  . Sexual activity: Yes    Birth control/protection: None  Other Topics Concern  . Not on file  Social History Narrative  . Not on file   Social Determinants of Health   Financial Resource Strain:   . Difficulty of Paying Living Expenses: Not on file  Food Insecurity:   . Worried About Charity fundraiser in the Last Year: Not on file  . Ran Out of Food in the Last Year: Not on file  Transportation Needs:   . Lack of Transportation (Medical): Not on file  . Lack of Transportation (Non-Medical): Not on file  Physical Activity:   . Days of Exercise per Week: Not on file  . Minutes of Exercise per Session: Not on file  Stress:   . Feeling of Stress : Not on file  Social Connections:   . Frequency of Communication with Friends and Family: Not on file  . Frequency of Social Gatherings with Friends and Family: Not on file  . Attends Religious Services: Not on file  . Active Member of Clubs or Organizations: Not on file  . Attends Archivist Meetings: Not on file  . Marital Status: Not on file  Intimate Partner Violence:   . Fear of Current or Ex-Partner: Not on file  . Emotionally Abused: Not on file  . Physically Abused: Not on file  . Sexually Abused: Not on file      ROS:  General: Negative for anorexia, weight loss, fever, chills, fatigue, weakness. Eyes: Negative for vision changes.  ENT: Negative for hoarseness, difficulty swallowing , nasal congestion. CV: Negative for chest pain, angina, palpitations, dyspnea on exertion, peripheral edema.  Respiratory: Negative for dyspnea at rest, dyspnea on exertion, cough, sputum, wheezing.  GI: See history of present illness. GU:  Negative for dysuria, hematuria, urinary incontinence, urinary frequency,  nocturnal urination.  MS: Negative for joint pain, low back pain.  Derm: Negative for rash or itching.  Neuro: Negative for weakness, abnormal sensation, seizure, frequent headaches, memory loss, confusion.  Psych: Negative for anxiety, depression, suicidal ideation, hallucinations.  Endo: Negative for unusual weight change.  Heme: Negative for bruising or bleeding. Allergy: Negative for rash or hives.    Physical Examination:  There were no vitals taken for this visit.   General: Well-nourished, well-developed in no acute distress.  Head: Normocephalic, atraumatic.   Eyes: Conjunctiva pink, no icterus. Mouth: Oropharyngeal mucosa moist and pink , no lesions erythema or exudate.  Neck: Supple without thyromegaly, masses, or lymphadenopathy.  Lungs: Clear to auscultation bilaterally.  Heart: Regular rate and rhythm, no murmurs rubs or gallops.  Abdomen: Bowel sounds are normal, nontender, nondistended, no hepatosplenomegaly or masses, no abdominal bruits or    hernia , no rebound or guarding.   Rectal: *** Extremities: No lower extremity edema. No clubbing or deformities.  Neuro: Alert and oriented x 4 , grossly normal neurologically.  Skin: Warm and dry, no rash or jaundice.   Psych: Alert and cooperative, normal mood and affect.  Labs: Lab Results  Component Value Date   CREATININE 0.89 10/04/2019   BUN 17 10/04/2019   NA 138 10/04/2019   K 4.0 10/04/2019   CL 106 10/04/2019   CO2 26 10/04/2019   Lab Results  Component Value Date   WBC 5.2 10/04/2019   HGB 14.0 10/04/2019   HCT 42.8 10/04/2019   MCV 90.9 10/04/2019   PLT 194 10/04/2019   No results found for: ALT, AST, GGT, ALKPHOS, BILITOT   Imaging Studies: No results found.

## 2019-11-30 ENCOUNTER — Ambulatory Visit (HOSPITAL_COMMUNITY): Admission: RE | Admit: 2019-11-30 | Payer: 59 | Source: Ambulatory Visit

## 2019-12-21 ENCOUNTER — Other Ambulatory Visit: Payer: Self-pay | Admitting: Cardiology

## 2020-01-06 ENCOUNTER — Ambulatory Visit: Payer: 59 | Admitting: Cardiology

## 2020-01-06 NOTE — Progress Notes (Deleted)
Clinical Summary [name] is a 54 y.o.male  seen today for follow up of the following medical problems.   1.Afib - 07/2018 2 week eventmonitor showed coarse afib vs aflutter with several RVR episodes, new diagnosis for patientat that time - he did not follow up. We had planned to recheck EKG in a few weeks after our last visit and plan for DCCV if he was still in afib -further delay in cardioversion attempt awaiting [facility]  - s/p DCCV attempt 07/21/19, failed to convert to SR.Focusing on rate control strategy   - rare palpitations at times.  - no bleeding on eliquis.     2. Chronic systolic HF - 10/6576 echo LVEF 55% - 06/2019 echo LVEF 30-35%, mod RV dysfunction  - no recent symptoms - compliant with meds   3. OSA - followed by Dr Elsworth Soho, moderate OSA. Awaiting cpap Past Medical History:  Diagnosis Date  . Anal fissure   . Anxiety   . Asthma    "mild" per patient. inhaler prn  . Cancer ([place])    right shoulder melanoma  . COPD (chronic obstructive pulmonary disease) ([place])    "mild" per patient, no smoking history, possibly due to working in a [employer]  . Depression   . GERD (gastroesophageal reflux disease)   . H/O hiatal hernia   . History of kidney stones   . Hypertension   . Obesity (BMI 30-39.9) AUG 2013 266 LBS   FEB 2015 284 LBS     No Known Allergies   Current Outpatient Medications  Medication Sig Dispense Refill  . acetaminophen (TYLENOL) 500 MG tablet Take 1,000 mg by mouth every 6 (six) hours as needed for moderate pain.     Marland Kitchen albuterol (PROAIR HFA) 108 (90 Base) MCG/ACT inhaler Inhale 1 puff into the lungs every 6 (six) hours as needed for wheezing or shortness of breath. 3.7 g 1  . apixaban (ELIQUIS) 5 MG TABS tablet Take 1 tablet (5 mg total) by mouth 2 (two) times daily. 180 tablet 2  . ENTRESTO 49-51 MG TAKE 1 TABLET BY MOUTH TWICE DAILY 60 tablet 11  . escitalopram (LEXAPRO) 20 MG tablet Take 20 mg by mouth  daily.    . Magnesium 250 MG TABS Take 250 mg by mouth daily.     . metoprolol succinate (TOPROL-XL) 100 MG 24 hr tablet Take 1 tablet (100 mg total) by mouth in the morning and at bedtime. Take with or immediately following a meal. 60 tablet 11  . Omega-3 Fatty Acids (FISH OIL) 1000 MG CAPS Take by mouth.    . spironolactone (ALDACTONE) 25 MG tablet Take 1 tablet (25 mg total) by mouth daily. 90 tablet 3  . Vitamin D, Cholecalciferol, 25 MCG (1000 UT) TABS Take 1,000 Units by mouth daily.      No current facility-administered medications for this visit.     Past Surgical History:  Procedure Laterality Date  . anterior cervical disectomy    . APPENDECTOMY    . CARDIOVERSION N/A 07/21/2019   Procedure: CARDIOVERSION;  Surgeon: Arnoldo Lenis, MD;  [facility];  Service: Endoscopy;  Laterality: N/A;  . CIRCUMCISION    . COLONOSCOPY  11/21/2011   MOD IH    . DG ARTHRO THUMB*L*     removal of cyst  . FLEXIBLE SIGMOIDOSCOPY N/A 04/12/2013   HEMRRHOID BANDING X3.   . HEMORRHOID BANDING N/A 04/12/2013   Procedure: HEMORRHOID BANDING;  Surgeon: Danie Binder,  MD;  [facility];  Service: Endoscopy;  Laterality: N/A;  . NERVE, TENDON AND ARTERY REPAIR Left 02/21/2017   Procedure: WOUND EXPLORATION LEFT INDEX, LONG, AND RING FINGERS;  NERVE, TENDON AND ARTERY REPAIR;  Surgeon: Leanora Cover, MD;  Location: [place];  Service: Orthopedics;  Laterality: Left;     No Known Allergies    Family History  Problem Relation Age of Onset  . Liver disease Brother        ? etiology     Social History [name] reports that he has never smoked. He has never used smokeless tobacco. [name] reports previous alcohol use.   Review of Systems CONSTITUTIONAL: No weight loss, fever, chills, weakness or fatigue.  HEENT: Eyes: No visual loss, blurred vision, double vision or yellow sclerae.No hearing loss, sneezing, congestion, runny nose or sore throat.    SKIN: No rash or itching.  CARDIOVASCULAR:  RESPIRATORY: No shortness of breath, cough or sputum.  GASTROINTESTINAL: No anorexia, nausea, vomiting or diarrhea. No abdominal pain or blood.  GENITOURINARY: No burning on urination, no polyuria NEUROLOGICAL: No headache, dizziness, syncope, paralysis, ataxia, numbness or tingling in the extremities. No change in bowel or bladder control.  MUSCULOSKELETAL: No muscle, back pain, joint pain or stiffness.  LYMPHATICS: No enlarged nodes. No history of splenectomy.  PSYCHIATRIC: No history of depression or anxiety.  ENDOCRINOLOGIC: No reports of sweating, cold or heat intolerance. No polyuria or polydipsia.  Marland Kitchen   Physical Examination There were no vitals filed for this visit. There were no vitals filed for this visit.  Gen: resting comfortably, no acute distress HEENT: no scleral icterus, pupils equal round and reactive, no palptable cervical adenopathy,  CV Resp: Clear to auscultation bilaterally GI: abdomen is soft, non-tender, non-distended, normal bowel sounds, no hepatosplenomegaly MSK: extremities are warm, no edema.  Skin: warm, no rash Neuro:  no focal deficits Psych: appropriate affect   Diagnostic Studies  05/2013 Echo LVEF 60-65%, mild LVH, normal diastolic  04/4578 Stress echo Stress results: Maximal heart rate during stress was 157bpm (97% of maximal predicted heart rate). The maximal predicted heart rate was 173bpm.The target heart rate was achieved. The heart rate response to stress was normal. There was a normal resting blood pressure with an appropriate response to stress. The rate-pressure product for the peak heart rate and blood pressure was 27975mm Hg/min. The patient experienced no chest pain during stress. Functional capacity was normal.  ------------------------------------------------------------ Baseline:  - The estimated LV ejection fraction was 55%. - Hypokinesis of the distallateral and apical LV  myocardium. Immediate post stress:  - LV global systolic function was vigorous. - Normal wall motion; no LV regional wall motion abnormalities.  02/2014 PFTs Mild obstruction consistent with mild asthma/COPD  06/2019 echo IMPRESSIONS    1. Left ventricular ejection fraction, by estimation, is 30 to 35%. The  left ventricle has moderately decreased function. The left ventricle  demonstrates global hypokinesis. The left ventricular internal cavity size  was mildly dilated. There is mild  left ventricular hypertrophy. Left ventricular diastolic parameters are  indeterminate in the setting of atrial fibrillation.  2. Right ventricular systolic function is moderately reduced. The right  ventricular size is moderately enlarged. Tricuspid regurgitation signal is  inadequate for assessing PA pressure.  3. Left atrial size was mild to moderately dilated.  4. Right atrial size was moderately dilated.  5. The mitral valve is grossly normal. Trivial mitral valve  regurgitation.  6. The aortic valve is tricuspid.  Aortic valve regurgitation is not  visualized.  7. The inferior vena cava is normal in size with greater than 50%  respiratory variability, suggesting right atrial pressure of 3 mmHg.     Assessment and Plan  1. Persistent afib - failed DCCV - continue rate control at this time. - no recent symptoms, continue current meds  2. Chronic sysotlic HF - relatively new diagnosis, LVEF 30-35% - suspect tachy mediated given afib and previously uncontrolled rates - we will start aldactone 12.5mg  daily, check BMET in 2 weeks  - would repeat echo in approx 3 months, if ongoing dysfunction consider cath at that time.    3. Rectal bleeding - refer to GI - will add cbc to upcoming labs - intermittent, for now continue anticoagulation      Arnoldo Lenis, M.D.

## 2020-04-13 ENCOUNTER — Telehealth: Payer: Self-pay | Admitting: Cardiology

## 2020-04-13 MED ORDER — APIXABAN 5 MG PO TABS: 5.0000 mg | ORAL_TABLET | Freq: Two times a day (BID) | ORAL | 2 refills | Status: DC

## 2020-04-13 NOTE — Telephone Encounter (Signed)
Refilled  eliquis per request

## 2020-04-13 NOTE — Addendum Note (Signed)
Addended by: Levonne Hubert on: 04/13/2020 03:36 PM   Modules accepted: Orders

## 2020-04-13 NOTE — Telephone Encounter (Signed)
New message     *STAT* If patient is at the pharmacy, call can be transferred to refill team.   1. Which medications need to be refilled? (please list name of each medication and dose if known) eliquis   2. Which pharmacy/location (including street and city if local pharmacy) is medication to be sent to? [facility]   3. Do they need a 30 day or 90 day supply? [place]

## 2020-04-14 ENCOUNTER — Other Ambulatory Visit (HOSPITAL_COMMUNITY): Payer: 59

## 2020-04-14 ENCOUNTER — Other Ambulatory Visit: Payer: Self-pay

## 2020-04-14 MED ORDER — RIVAROXABAN 20 MG PO TABS
20.0000 mg | ORAL_TABLET | Freq: Every day | ORAL | 6 refills | Status: DC
Start: 2020-04-14 — End: 2020-11-14

## 2020-04-14 NOTE — Progress Notes (Signed)
Drug therapy change. Stopped Eliquis 5 mg tablets and started Xarelto 20 mg tablets once daily per Dr. Harl Bowie.

## 2020-04-21 ENCOUNTER — Other Ambulatory Visit: Payer: Self-pay

## 2020-04-21 ENCOUNTER — Ambulatory Visit (HOSPITAL_COMMUNITY)
Admission: RE | Admit: 2020-04-21 | Discharge: 2020-04-21 | Disposition: A | Discharge status: Home / Self Care | Payer: 59 | Source: Ambulatory Visit | Attending: Physician Assistant | Admitting: Physician Assistant

## 2020-04-21 DIAGNOSIS — I428 Other cardiomyopathies: Secondary | ICD-10-CM | POA: Diagnosis not present

## 2020-04-21 LAB — ECHOCARDIOGRAM COMPLETE
Area-P 1/2: 2.73 cm2
S' Lateral: 3.78 cm

## 2020-04-21 NOTE — Progress Notes (Signed)
*  PRELIMINARY RESULTS* Echocardiogram 2D Echocardiogram has been performed.  Leavy Cella 04/21/2020, 10:34 AM

## 2020-04-25 ENCOUNTER — Telehealth: Payer: Self-pay | Admitting: *Deleted

## 2020-04-25 ENCOUNTER — Telehealth: Payer: Self-pay | Admitting: Physician Assistant

## 2020-04-25 DIAGNOSIS — I428 Other cardiomyopathies: Secondary | ICD-10-CM

## 2020-04-25 DIAGNOSIS — I5022 Chronic systolic (congestive) heart failure: Secondary | ICD-10-CM

## 2020-04-25 NOTE — Telephone Encounter (Signed)
Spoke with patient regarding preferred weekdays and times for scheduling the Cardiac MRI ordered by Estella Husk, PA.  Informed patient as soon as we hear regarding the insurance prior authorization, I wil be in touch with the appointment.  Patient voiced his understanding

## 2020-04-25 NOTE — Telephone Encounter (Signed)
-----   Message from Imogene Burn, [place] sent at 04/24/2020  7:48 AM EST ----- Echo shows some improvement in heart function to 40-45% but recommend cardiac MRI to rule out non compaction cardiomyopathy. Then schedule f/u with Dr. Harl Bowie. thanks

## 2020-04-27 ENCOUNTER — Encounter: Payer: Self-pay | Admitting: Physician Assistant

## 2020-04-27 NOTE — Telephone Encounter (Signed)
Left message for patient regarding Friday 06/09/20 Cardiac MRI  8:00 am appointment at [facility]---arrival time is 7:30 am---1st floor admissions office for check in.  Will mail information to patient and it is available in My Chart.  Requested patient call with questions or concerns.

## 2020-06-07 ENCOUNTER — Telehealth (HOSPITAL_COMMUNITY): Payer: Self-pay | Admitting: Emergency Medicine

## 2020-06-07 NOTE — Telephone Encounter (Signed)
Attempted to call patient regarding upcoming cardiac MR appointment. Left message on voicemail with name and callback number Ernesha Ramone RN Navigator Cardiac Imaging [facility] [phone] Office [phone] Cell  

## 2020-06-09 ENCOUNTER — Telehealth: Payer: Self-pay

## 2020-06-09 ENCOUNTER — Other Ambulatory Visit: Payer: Self-pay

## 2020-06-09 ENCOUNTER — Ambulatory Visit (HOSPITAL_COMMUNITY)
Admission: RE | Admit: 2020-06-09 | Discharge: 2020-06-09 | Disposition: A | Discharge status: Home / Self Care | Payer: 59 | Source: Ambulatory Visit | Attending: Physician Assistant | Admitting: Physician Assistant

## 2020-06-09 DIAGNOSIS — I428 Other cardiomyopathies: Secondary | ICD-10-CM

## 2020-06-09 MED ORDER — GADOBUTROL 1 MMOL/ML IV SOLN
10.0000 mL | Freq: Once | INTRAVENOUS | Status: AC | PRN
  Administered 2020-06-09: 10 mL via INTRAVENOUS

## 2020-06-09 NOTE — Telephone Encounter (Signed)
-----   Message from Imogene Burn, PA-C sent at 06/09/2020  2:11 PM EDT ----- MRI similar to echo with decreased heart function. Please schedule follow up with Dr. Harl Bowie

## 2020-06-09 NOTE — Telephone Encounter (Signed)
Contacted patient who verbalized understanding. Patient has an appointment with Zandra Abts, MD on 07/21/20.

## 2020-06-19 ENCOUNTER — Other Ambulatory Visit: Payer: Self-pay | Admitting: Cardiology

## 2020-07-21 ENCOUNTER — Encounter: Payer: Self-pay | Admitting: Cardiology

## 2020-07-21 ENCOUNTER — Ambulatory Visit: Payer: 59 | Admitting: Cardiology

## 2020-07-21 ENCOUNTER — Other Ambulatory Visit: Payer: Self-pay

## 2020-07-21 VITALS — BP 122/84 | HR 74 | Ht 74.0 in | Wt 301.0 lb

## 2020-07-21 DIAGNOSIS — I4819 Other persistent atrial fibrillation: Secondary | ICD-10-CM

## 2020-07-21 DIAGNOSIS — I5022 Chronic systolic (congestive) heart failure: Secondary | ICD-10-CM | POA: Diagnosis not present

## 2020-07-21 MED ORDER — SACUBITRIL-VALSARTAN 97-103 MG PO TABS: 1.0000 | ORAL_TABLET | Freq: Two times a day (BID) | ORAL | 3 refills | Status: DC

## 2020-07-21 MED ORDER — DAPAGLIFLOZIN PROPANEDIOL 10 MG PO TABS
10.0000 mg | ORAL_TABLET | Freq: Every day | ORAL | 3 refills | Status: DC
Start: 2020-07-21 — End: 2021-07-31

## 2020-07-21 NOTE — Patient Instructions (Addendum)
Medication Instructions:  Your physician has recommended you make the following change in your medication: INCREASE Entresto to 97-102 mg tablets twice daily START Farxiga 10 mg tablets once daily  *If you need a refill on your cardiac medications before your next appointment, please call your pharmacy*   Lab Work: BMET- 2 weeks Lab work has been requested from your primary care provider If you have labs (blood work) drawn today and your tests are completely normal, you will receive your results only by: Marland Kitchen MyChart Message (if you have MyChart) OR . A paper copy in the mail If you have any lab test that is abnormal or we need to change your treatment, we will call you to review the results.   Testing/Procedures: None   Follow-Up: At [facility], you and your health needs are our priority.  As part of our continuing mission to provide you with exceptional heart care, we have created designated Provider Care Teams.  These Care Teams include your primary Cardiologist (physician) and Advanced Practice Providers (APPs -  Physician Assistants and Nurse Practitioners) who all work together to provide you with the care you need, when you need it.  We recommend signing up for the patient portal called "MyChart".  Sign up information is provided on this After Visit Summary.  MyChart is used to connect with patients for Virtual Visits (Telemedicine).  Patients are able to view lab/test results, encounter notes, upcoming appointments, etc.  Non-urgent messages can be sent to your provider as well.   To learn more about what you can do with MyChart, go to [email or web address].    Your next appointment:   3 month(s)  The format for your next appointment:   In Person  Provider:   Carlyle Dolly, MD   Other Instructions   Farxiga Sample [phone]) Lot #:[id number] Exp: 10-02-22

## 2020-07-21 NOTE — Progress Notes (Signed)
Clinical Summary [name] is a 55 y.o.male seen today for follow up of the following medical problems.   1.Afib - 07/2018 2 week eventmonitor showed coarse afib vs aflutter with several RVR episodes, new diagnosis for patientat that time - he did not follow up. We had planned to recheck EKG in a few weeks after our last visit and plan for DCCV if he was still in afib -further delay in cardioversion attempt awaiting [facility]  - s/p DCCV attempt 5/19, failed to convert to SR.Focusing on rate control strategy  - no recent symptoms - compliant with meds    2. 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 OSA - followed by Dr Elsworth Oliver, moderate OSA. - has not started cpap     Past Medical History:  Diagnosis Date  . Anal fissure   . Anxiety   . Asthma    "mild" per patient. inhaler prn  . Cancer ([place])    right shoulder melanoma  . COPD (chronic obstructive pulmonary disease) ([place])    "mild" per patient, no smoking history, possibly due to working in a [employer]  . Depression   . GERD (gastroesophageal reflux disease)   . H/O hiatal hernia   . History of kidney stones   . Hypertension   . Obesity (BMI 30-39.9) AUG 2013 266 LBS   FEB 2015 284 LBS     No Known Allergies   Current Outpatient Medications  Medication Sig Dispense Refill  . acetaminophen (TYLENOL) 500 MG tablet Take 1,000 mg by mouth every 6 (six) hours as needed for moderate pain.     Marland Kitchen albuterol (PROAIR HFA) 108 (90 Base) MCG/ACT inhaler Inhale 1 puff into the lungs every 6 (six) hours as needed for wheezing or shortness of breath. 3.7 g 1  . ENTRESTO 49-51 MG TAKE 1 TABLET BY MOUTH TWICE DAILY 60 tablet 11  . escitalopram (LEXAPRO) 20 MG tablet Take 20 mg by mouth daily.    . Magnesium 250 MG TABS Take 250 mg by mouth daily.     . metoprolol  succinate (TOPROL-XL) 100 MG 24 hr tablet Take 1 tablet (100 mg total) by mouth in the morning and at bedtime. Take with or immediately following a meal. 60 tablet 11  . Omega-3 Fatty Acids (FISH OIL) 1000 MG CAPS Take by mouth.    . rivaroxaban (XARELTO) 20 MG TABS tablet Take 1 tablet (20 mg total) by mouth daily with supper. 30 tablet 6  . spironolactone (ALDACTONE) 25 MG tablet Take 1 tablet (25 mg total) by mouth daily. 90 tablet 3  . Vitamin D, Cholecalciferol, 25 MCG (1000 UT) TABS Take 1,000 Units by mouth daily.      No current facility-administered medications for this visit.     Past Surgical History:  Procedure Laterality Date  . anterior cervical disectomy    . APPENDECTOMY    . CARDIOVERSION N/A 07/21/2019   Procedure: CARDIOVERSION;  Surgeon: Arnoldo Lenis, MD;  [facility];  Service: Endoscopy;  Laterality: N/A;  . CIRCUMCISION    . COLONOSCOPY  11/21/2011   MOD IH    . DG ARTHRO THUMB*L*     removal of cyst  . FLEXIBLE SIGMOIDOSCOPY N/A 04/12/2013   HEMRRHOID BANDING X3.   . HEMORRHOID BANDING N/A 04/12/2013   Procedure: HEMORRHOID BANDING;  Surgeon: Danie Binder,  MD;  [facility];  Service: Endoscopy;  Laterality: N/A;  . NERVE, TENDON AND ARTERY REPAIR Left 02/21/2017   Procedure: WOUND EXPLORATION LEFT INDEX, LONG, AND RING FINGERS;  NERVE, TENDON AND ARTERY REPAIR;  Surgeon: Leanora Cover, MD;  Location: [place];  Service: Orthopedics;  Laterality: Left;     No Known Allergies    Family History  Problem Relation Age of Onset  . Liver disease Brother        ? etiology     Social History [name] reports that he has never smoked. He has never used smokeless tobacco. [name] reports previous alcohol use.   Review of Systems CONSTITUTIONAL: No weight loss, fever, chills, weakness or fatigue.  HEENT: Eyes: No visual loss, blurred vision, double vision or yellow sclerae.No hearing loss, sneezing,  congestion, runny nose or sore throat.  SKIN: No rash or itching.  CARDIOVASCULAR: per hpi RESPIRATORY: No shortness of breath, cough or sputum.  GASTROINTESTINAL: No anorexia, nausea, vomiting or diarrhea. No abdominal pain or blood.  GENITOURINARY: No burning on urination, no polyuria NEUROLOGICAL: No headache, dizziness, syncope, paralysis, ataxia, numbness or tingling in the extremities. No change in bowel or bladder control.  MUSCULOSKELETAL: No muscle, back pain, joint pain or stiffness.  LYMPHATICS: No enlarged nodes. No history of splenectomy.  PSYCHIATRIC: No history of depression or anxiety.  ENDOCRINOLOGIC: No reports of sweating, cold or heat intolerance. No polyuria or polydipsia.  Marland Kitchen   Physical Examination Today's Vitals   07/21/20 1340  BP: 122/84  Pulse: 74  SpO2: 97%  Weight: (!) 301 lb (136.5 kg)  Height: 6\' 2"  (1.88 m)   Body mass index is 38.65 kg/m.  Gen: resting comfortably, no acute distress HEENT: no scleral icterus, pupils equal round and reactive, no palptable cervical adenopathy,  CV Resp: Clear to auscultation bilaterally GI: abdomen is soft, non-tender, non-distended, normal bowel sounds, no hepatosplenomegaly MSK: extremities are warm, no edema.  Skin: warm, no rash Neuro:  no focal deficits Psych: appropriate affect   Diagnostic Studies 04/2020 echo 1. Trabeculated apex consider f/u cardiac MRI r/o non compaction if  clinically indicated. Left ventricular ejection fraction, by estimation,  is 40 to 45%. The left ventricle has mildly decreased function. The left  ventricle demonstrates global  hypokinesis. There is mild left ventricular hypertrophy. Left ventricular  diastolic parameters are indeterminate.  2. Right ventricular systolic function is normal. The right ventricular  size is normal.  3. Left atrial size was mildly dilated.  4. Right atrial size was mildly dilated.  5. The mitral valve is normal in structure. No evidence of  mitral valve  regurgitation. No evidence of mitral stenosis.  6. The aortic valve is normal in structure. Aortic valve regurgitation is  not visualized. No aortic stenosis is present.  7. The inferior vena cava is normal in size with greater than 50%  respiratory variability, suggesting right atrial pressure of 3 mmHg.        Assessment and Plan  1. Persistent afib - failed DCCV - continue rate control, no current symptoms   2. Chronic sysotlic HF - thought to be tachy mediated initially at time of diagnosis, plan to control arrhythmia and follow LVEF, did not pursue cath at that time.  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 May consider in the future depending clinical course - increase entresto  to 97/102mg  bid, start fargixa 10mg  daily.  - regarding the noncompaction already on anticoag for his afib        Arnoldo Lenis, M.D.

## 2020-07-26 ENCOUNTER — Other Ambulatory Visit: Payer: Self-pay | Admitting: Cardiology

## 2020-10-15 ENCOUNTER — Other Ambulatory Visit: Payer: Self-pay | Admitting: Physician Assistant

## 2020-11-03 ENCOUNTER — Telehealth: Payer: Self-pay | Admitting: Pulmonary Disease

## 2020-11-03 ENCOUNTER — Ambulatory Visit: Payer: 59 | Admitting: Cardiology

## 2020-11-03 ENCOUNTER — Encounter: Payer: Self-pay | Admitting: Cardiology

## 2020-11-03 VITALS — BP 110/70 | HR 62 | Ht 74.0 in | Wt 295.8 lb

## 2020-11-03 DIAGNOSIS — I5022 Chronic systolic (congestive) heart failure: Secondary | ICD-10-CM | POA: Diagnosis not present

## 2020-11-03 DIAGNOSIS — I4819 Other persistent atrial fibrillation: Secondary | ICD-10-CM

## 2020-11-03 NOTE — Telephone Encounter (Signed)
Spoke to pt.  Pt saw cardiologist who told pt to contact our office about getting his cpap machine.  Pt was last seen by Dr. Elsworth Soho in the [place] office on 06/30/19 & order for CPAP was sent to [facility] on 09/03/19.  Scheduled pt w/ RA in [place] on 10/21 for a f/u since it has been over 1 year seen pt was seen & cpap ordered.

## 2020-11-03 NOTE — Patient Instructions (Signed)
Medication Instructions:  Your physician recommends that you continue on your current medications as directed. Please refer to the Current Medication list given to you today.  *If you need a refill on your cardiac medications before your next appointment, please call your pharmacy*   Lab Work: None today  If you have labs (blood work) drawn today and your tests are completely normal, you will receive your results only by: [place] (if you have MyChart) OR A paper copy in the mail If you have any lab test that is abnormal or we need to change your treatment, we will call you to review the results.   Testing/Procedures: None today    Follow-Up: At [facility], you and your health needs are our priority.  As part of our continuing mission to provide you with exceptional heart care, we have created designated Provider Care Teams.  These Care Teams include your primary Cardiologist (physician) and Advanced Practice Providers (APPs -  Physician Assistants and Nurse Practitioners) who all work together to provide you with the care you need, when you need it.  We recommend signing up for the patient portal called "MyChart".  Sign up information is provided on this After Visit Summary.  MyChart is used to connect with patients for Virtual Visits (Telemedicine).  Patients are able to view lab/test results, encounter notes, upcoming appointments, etc.  Non-urgent messages can be sent to your provider as well.   To learn more about what you can do with MyChart, go to [email or web address].    Your next appointment:   3 month(s)  The format for your next appointment:   In Person  Provider:   Carlyle Dolly, MD   Other Instructions None

## 2020-11-03 NOTE — Progress Notes (Signed)
Clinical Summary [name] is a 55 y.o.male seen today for follow up of the following medical problems.    1. Afib - 07/2018 2 week event monitor showed coarse afib vs aflutter with several RVR episodes, new diagnosis for patient at that time  - he did not follow up. We had planned to recheck EKG in a few weeks after our last visit and plan for DCCV if he was still in afib -further delay in cardioversion attempt awaiting [facility]   - s/p DCCV attempt 5/19, failed to convert to SR. Focusing on rate control strategy   - no recent palpitations - no bleeding on xarelto   2. 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 Chronic SOB with high levels of exertion.    3. OSA - followed by Dr Elsworth Soho, moderate OSA. - awaiting cpap - chronic fatigue  Past Medical History:  Diagnosis Date   Anal fissure    Anxiety    Asthma    "mild" per patient. inhaler prn   Cancer ([facility])    right shoulder melanoma   COPD (chronic obstructive pulmonary disease) ([place])    "mild" per patient, no smoking history, possibly due to working in a [employer]   Depression    GERD (gastroesophageal reflux disease)    H/O hiatal hernia    History of kidney stones    Hypertension    Obesity (BMI 30-39.9) AUG 2013 266 LBS   FEB 2015 284 LBS     No Known Allergies   Current Outpatient Medications  Medication Sig Dispense Refill   acetaminophen (TYLENOL) 500 MG tablet Take 1,000 mg by mouth every 6 (six) hours as needed for moderate pain.      albuterol (PROAIR HFA) 108 (90 Base) MCG/ACT inhaler Inhale 1 puff into the lungs every 6 (six) hours as needed for wheezing or shortness of breath. 3.7 g 1   dapagliflozin propanediol (FARXIGA) 10 MG TABS tablet Take 1 tablet (10 mg  total) by mouth daily before breakfast. 90 tablet 3   escitalopram (LEXAPRO) 20 MG tablet Take 20 mg by mouth daily.     Magnesium 250 MG TABS Take 250 mg by mouth daily.      metoprolol succinate (TOPROL-XL) 100 MG 24 hr tablet TAKE 1 TABLET BY MOUTH EVERY MORNING and TAKE 1 TABLET AT BEDTIME WITH OR IMMEDIATELY FOLLOWING A MEAL 60 tablet 11   Omega-3 Fatty Acids (FISH OIL) 1000 MG CAPS Take by mouth.     rivaroxaban (XARELTO) 20 MG TABS tablet Take 1 tablet (20 mg total) by mouth daily with supper. 30 tablet 6   sacubitril-valsartan (ENTRESTO) 97-103 MG Take 1 tablet by mouth 2 (two) times daily. 180 tablet 3   spironolactone (ALDACTONE) 25 MG tablet TAKE 1 TABLET BY MOUTH DAILY 90 tablet 3   tamsulosin (FLOMAX) 0.4 MG CAPS capsule Take 0.4 mg by mouth daily.     Vitamin D, Cholecalciferol, 25 MCG (1000 UT) TABS Take 1,000 Units by mouth daily.      No current facility-administered medications for this visit.     Past Surgical History:  Procedure Laterality Date   anterior cervical disectomy     APPENDECTOMY     CARDIOVERSION N/A 07/21/2019   Procedure: CARDIOVERSION;  Surgeon: Harl Bowie,  Alphonse Guild, MD;  [facility];  Service: Endoscopy;  Laterality: N/A;   CIRCUMCISION     COLONOSCOPY  11/21/2011   MOD IH     DG ARTHRO THUMB*L*     removal of cyst   FLEXIBLE SIGMOIDOSCOPY N/A 04/12/2013   HEMRRHOID BANDING X3.    HEMORRHOID BANDING N/A 04/12/2013   Procedure: Thayer Jew;  Surgeon: Danie Binder, MD;  [facility];  Service: Endoscopy;  Laterality: N/A;   NERVE, TENDON AND ARTERY REPAIR Left 02/21/2017   Procedure: WOUND EXPLORATION LEFT INDEX, LONG, AND RING FINGERS;  NERVE, TENDON AND ARTERY REPAIR;  Surgeon: Leanora Cover, MD;  Location: [place];  Service: Orthopedics;  Laterality: Left;     No Known Allergies    Family History  Problem Relation Age of Onset   Liver disease Brother        ? etiology     Social History Mr.  Oliver reports that he has never smoked. He has never used smokeless tobacco. [name] reports that he does not currently use alcohol.   Review of Systems CONSTITUTIONAL: No weight loss, fever, chills, weakness or fatigue.  HEENT: Eyes: No visual loss, blurred vision, double vision or yellow sclerae.No hearing loss, sneezing, congestion, runny nose or sore throat.  SKIN: No rash or itching.  CARDIOVASCULAR: per hpi RESPIRATORY: No shortness of breath, cough or sputum.  GASTROINTESTINAL: No anorexia, nausea, vomiting or diarrhea. No abdominal pain or blood.  GENITOURINARY: No burning on urination, no polyuria NEUROLOGICAL: No headache, dizziness, syncope, paralysis, ataxia, numbness or tingling in the extremities. No change in bowel or bladder control.  MUSCULOSKELETAL: No muscle, back pain, joint pain or stiffness.  LYMPHATICS: No enlarged nodes. No history of splenectomy.  PSYCHIATRIC: No history of depression or anxiety.  ENDOCRINOLOGIC: No reports of sweating, cold or heat intolerance. No polyuria or polydipsia.  Marland Kitchen   Physical Examination Today's Vitals   11/03/20 1038  BP: 110/70  Pulse: 62  SpO2: 94%  Weight: 295 lb 12.8 oz (134.2 kg)  Height: '6\' 2"'$  (1.88 m)   Body mass index is 37.98 kg/m.  Gen: resting comfortably, no acute distress HEENT: no scleral icterus, pupils equal round and reactive, no palptable cervical adenopathy,  CV: RRR, no m/rgno jvd Resp: Clear to auscultation bilaterally GI: abdomen is soft, non-tender, non-distended, normal bowel sounds, no hepatosplenomegaly MSK: extremities are warm, no edema.  Skin: warm, no rash Neuro:  no focal deficits Psych: appropriate affect   Diagnostic Studies 04/2020 echo 1. Trabeculated apex consider f/u cardiac MRI r/o non compaction if  clinically indicated. Left ventricular ejection fraction, by estimation,  is 40 to 45%. The left ventricle has mildly decreased function. The left  ventricle demonstrates global   hypokinesis. There is mild left ventricular hypertrophy. Left ventricular  diastolic parameters are indeterminate.   2. Right ventricular systolic function is normal. The right ventricular  size is normal.   3. Left atrial size was mildly dilated.   4. Right atrial size was mildly dilated.   5. The mitral valve is normal in structure. No evidence of mitral valve  regurgitation. No evidence of mitral stenosis.   6. The aortic valve is normal in structure. Aortic valve regurgitation is  not visualized. No aortic stenosis is present.   7. The inferior vena cava is normal in size with greater than 50%  respiratory variability, suggesting right atrial pressure of 3 mmHg.  Assessment and Plan   1. Persistent afib - failed DCCV - doing well with rate control, continue current meds including xarelto.      2. Chronic sysotlic HF - thought to be tachy mediated initially at time of diagnosis, plan to control arrhythmia and follow LVEF, did not pursue cath at that time.  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 May consider in the future depending clinical course - tolerating optimal HF medication regimen all at goal doses - no recent symptoms - f/u 3 months, likely repeat echo after that visit          Arnoldo Lenis, M.D.

## 2020-11-14 ENCOUNTER — Other Ambulatory Visit: Payer: Self-pay | Admitting: Cardiology

## 2020-11-14 NOTE — Telephone Encounter (Signed)
Prescription refill request for Xarelto received.  Indication: Atrial fib Last office visit: 11/03/20  Zandra Abts MD Weight: 134.2kg Age: 55 Scr: 1.22 on 10/04/20 CrCl: 131.39  Based on above findings Xarelto '20mg'$  daily is the appropriate dose.  Refill aproved.

## 2020-12-22 ENCOUNTER — Ambulatory Visit: Payer: 59 | Admitting: Pulmonary Disease

## 2020-12-25 ENCOUNTER — Emergency Department (HOSPITAL_COMMUNITY)
Admission: EM | Admit: 2020-12-25 | Discharge: 2020-12-26 | Disposition: A | Discharge status: Home / Self Care | Payer: 59 | Attending: Emergency Medicine | Admitting: Emergency Medicine

## 2020-12-25 ENCOUNTER — Encounter (HOSPITAL_COMMUNITY): Payer: Self-pay | Admitting: Emergency Medicine

## 2020-12-25 ENCOUNTER — Emergency Department (HOSPITAL_COMMUNITY): Payer: 59

## 2020-12-25 DIAGNOSIS — R5383 Other fatigue: Secondary | ICD-10-CM | POA: Insufficient documentation

## 2020-12-25 DIAGNOSIS — M79602 Pain in left arm: Secondary | ICD-10-CM | POA: Insufficient documentation

## 2020-12-25 DIAGNOSIS — Z5321 Procedure and treatment not carried out due to patient leaving prior to being seen by health care provider: Secondary | ICD-10-CM | POA: Insufficient documentation

## 2020-12-25 DIAGNOSIS — R61 Generalized hyperhidrosis: Secondary | ICD-10-CM | POA: Diagnosis not present

## 2020-12-25 LAB — CBC WITH DIFFERENTIAL/PLATELET
Abs Immature Granulocytes: 0.02 10*3/uL (ref 0.00–0.07)
Basophils Absolute: 0 10*3/uL (ref 0.0–0.1)
Basophils Relative: 1 %
Eosinophils Absolute: 0.1 10*3/uL (ref 0.0–0.5)
Eosinophils Relative: 1 %
HCT: 44 % (ref 39.0–52.0)
Hemoglobin: 14.5 g/dL (ref 13.0–17.0)
Immature Granulocytes: 0 %
Lymphocytes Relative: 22 %
Lymphs Abs: 1.6 10*3/uL (ref 0.7–4.0)
MCH: 30 pg (ref 26.0–34.0)
MCHC: 33 g/dL (ref 30.0–36.0)
MCV: 90.9 fL (ref 80.0–100.0)
Monocytes Absolute: 0.4 10*3/uL (ref 0.1–1.0)
Monocytes Relative: 6 %
Neutro Abs: 4.9 10*3/uL (ref 1.7–7.7)
Neutrophils Relative %: 70 %
Platelets: 242 10*3/uL (ref 150–400)
RBC: 4.84 MIL/uL (ref 4.22–5.81)
RDW: 12.4 % (ref 11.5–15.5)
WBC: 7 10*3/uL (ref 4.0–10.5)
nRBC: 0 % (ref 0.0–0.2)

## 2020-12-25 LAB — BASIC METABOLIC PANEL
Anion gap: 8 (ref 5–15)
BUN: 12 mg/dL (ref 6–20)
CO2: 27 mmol/L (ref 22–32)
Calcium: 9.2 mg/dL (ref 8.9–10.3)
Chloride: 106 mmol/L (ref 98–111)
Creatinine, Ser: 1.07 mg/dL (ref 0.61–1.24)
GFR, Estimated: >60 mL/min (ref >60)
Glucose, Bld: 115 mg/dL — ABNORMAL HIGH (ref 70–99)
Potassium: 4.6 mmol/L (ref 3.5–5.1)
Sodium: 141 mmol/L (ref 135–145)

## 2020-12-25 LAB — TROPONIN I (HIGH SENSITIVITY)
Troponin I (High Sensitivity): 3 ng/L (ref <18)
Troponin I (High Sensitivity): 2 ng/L (ref <18)

## 2020-12-25 NOTE — ED Provider Notes (Signed)
Emergency Medicine Provider Triage Evaluation Note  [name] , a 55 y.o. male  was evaluated in triage.  Pt complains of weakness on Saturday with severe central chest pain that radiated to his back which resolved Sunday morning.  He denies any medical care at that time.  Today at work had an episode where he became very weak, lethargic, and diaphoretic on 415.  Was seen by the nurse at work and return to the ED.  This time he endorses left arm numbness but denies any chest pain.  Review of Systems  Positive: Chest pain now resolved, shortness of breath, diaphoresis, lethargy Negative: Fever, chills  Physical Exam  BP 113/74 (BP Location: Right Arm)   Pulse (!) 52   Temp 98.5 F (36.9 C) (Oral)   Resp 18   SpO2 97%  Gen:   Awake, no distress   Resp:  Normal effort  MSK:   Moves extremities without difficulty  Other:    Medical Decision Making  Medically screening exam initiated at 5:57 PM.  Appropriate orders placed.  [name] was informed that the remainder of the evaluation will be completed by another provider, this initial triage assessment does not replace that evaluation, and the importance of remaining in the ED until their evaluation is complete.  EKG without STEMI.  This chart was dictated using voice recognition software, Dragon. Despite the best efforts of this provider to proofread and correct errors, errors may still occur which can change documentation meaning.    [name] 12/25/20 1808    Tegeler, Gwenyth Allegra, MD 12/26/20 [phone]

## 2020-12-25 NOTE — ED Triage Notes (Signed)
Pt reports he went into work around 415, he became lethargic and diaphoretic. States he was having CP Saturday but none today. Left arm pain intermittently.

## 2020-12-25 NOTE — ED Notes (Signed)
Patient left on own accord °

## 2021-02-02 ENCOUNTER — Ambulatory Visit: Payer: 59 | Admitting: Cardiology

## 2021-02-02 ENCOUNTER — Encounter: Payer: Self-pay | Admitting: Cardiology

## 2021-02-02 NOTE — Progress Notes (Deleted)
Clinical Summary [name] is a 55 y.o.male seen today for follow up of the following medical problems.    1. Afib - 07/2018 2 week event monitor showed coarse afib vs aflutter with several RVR episodes, new diagnosis for patient at that time  - he did not follow up. We had planned to recheck EKG in a few weeks after our last visit and plan for DCCV if he was still in afib -further delay in cardioversion attempt awaiting [facility]   - s/p DCCV attempt 5/19, failed to convert to SR. Focusing on rate control strategy   - no recent palpitations - no bleeding on xarelto   2. 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 Chronic SOB with high levels of exertion.   **repeat echo   3. OSA - followed by Dr Elsworth Soho, moderate OSA. - awaiting cpap - chronic fatigue   4. Chest pain - ER visit 12/25/20, left AMA prior to completing workup - trop was neg x 2. EKG afib, chronic ST/T changes. CXR no acute process  Past Medical History:  Diagnosis Date   Anal fissure    Anxiety    Asthma    "mild" per patient. inhaler prn   Cancer (HCC)    right shoulder melanoma   COPD (chronic obstructive pulmonary disease) ([place])    "mild" per patient, no smoking history, possibly due to working in a [employer]   Depression    GERD (gastroesophageal reflux disease)    H/O hiatal hernia    History of kidney stones    Hypertension    Obesity (BMI 30-39.9) AUG 2013 266 LBS   FEB 2015 284 LBS     No Known Allergies   Current Outpatient Medications  Medication Sig Dispense Refill   acetaminophen (TYLENOL) 500 MG tablet Take 1,000 mg by mouth every 6 (six) hours as needed for moderate pain.      albuterol (PROAIR HFA) 108 (90 Base) MCG/ACT inhaler Inhale 1  puff into the lungs every 6 (six) hours as needed for wheezing or shortness of breath. 3.7 g 1   dapagliflozin propanediol (FARXIGA) 10 MG TABS tablet Take 1 tablet (10 mg total) by mouth daily before breakfast. 90 tablet 3   escitalopram (LEXAPRO) 20 MG tablet Take 20 mg by mouth daily.     Magnesium 250 MG TABS Take 250 mg by mouth daily.      metoprolol succinate (TOPROL-XL) 100 MG 24 hr tablet TAKE 1 TABLET BY MOUTH EVERY MORNING and TAKE 1 TABLET AT BEDTIME WITH OR IMMEDIATELY FOLLOWING A MEAL 60 tablet 11   Omega-3 Fatty Acids (FISH OIL) 1000 MG CAPS Take by mouth.     sacubitril-valsartan (ENTRESTO) 97-103 MG Take 1 tablet by mouth 2 (two) times daily. 180 tablet 3   spironolactone (ALDACTONE) 25 MG tablet TAKE 1 TABLET BY MOUTH DAILY 90 tablet 3   tamsulosin (FLOMAX) 0.4 MG CAPS capsule Take 0.4 mg by mouth daily. (Patient not taking: Reported on 11/03/2020)     Vitamin D, Cholecalciferol, 25 MCG (1000 UT) TABS Take 1,000 Units by mouth daily.      XARELTO 20 MG TABS tablet TAKE 1 TABLET BY MOUTH EVERY DAY WITH SUPPER 30 tablet 6   No current facility-administered medications for  this visit.     Past Surgical History:  Procedure Laterality Date   anterior cervical disectomy     APPENDECTOMY     CARDIOVERSION N/A 07/21/2019   Procedure: CARDIOVERSION;  Surgeon: Arnoldo Lenis, MD;  [facility];  Service: Endoscopy;  Laterality: N/A;   CIRCUMCISION     COLONOSCOPY  11/21/2011   MOD IH     DG ARTHRO THUMB*L*     removal of cyst   FLEXIBLE SIGMOIDOSCOPY N/A 04/12/2013   HEMRRHOID BANDING X3.    HEMORRHOID BANDING N/A 04/12/2013   Procedure: Thayer Jew;  Surgeon: Danie Binder, MD;  [facility];  Service: Endoscopy;  Laterality: N/A;   NERVE, TENDON AND ARTERY REPAIR Left 02/21/2017   Procedure: WOUND EXPLORATION LEFT INDEX, LONG, AND RING FINGERS;  NERVE, TENDON AND ARTERY REPAIR;  Surgeon: Leanora Cover, MD;  Location: [place];   Service: Orthopedics;  Laterality: Left;     No Known Allergies    Family History  Problem Relation Age of Onset   Liver disease Brother        ? etiology     Social History Mr. Fussell reports that he has never smoked. He has never used smokeless tobacco. Mr. Litt reports that he does not currently use alcohol.   Review of Systems CONSTITUTIONAL: No weight loss, fever, chills, weakness or fatigue.  HEENT: Eyes: No visual loss, blurred vision, double vision or yellow sclerae.No hearing loss, sneezing, congestion, runny nose or sore throat.  SKIN: No rash or itching.  CARDIOVASCULAR:  RESPIRATORY: No shortness of breath, cough or sputum.  GASTROINTESTINAL: No anorexia, nausea, vomiting or diarrhea. No abdominal pain or blood.  GENITOURINARY: No burning on urination, no polyuria NEUROLOGICAL: No headache, dizziness, syncope, paralysis, ataxia, numbness or tingling in the extremities. No change in bowel or bladder control.  MUSCULOSKELETAL: No muscle, back pain, joint pain or stiffness.  LYMPHATICS: No enlarged nodes. No history of splenectomy.  PSYCHIATRIC: No history of depression or anxiety.  ENDOCRINOLOGIC: No reports of sweating, cold or heat intolerance. No polyuria or polydipsia.  Marland Kitchen   Physical Examination There were no vitals filed for this visit. There were no vitals filed for this visit.  Gen: resting comfortably, no acute distress HEENT: no scleral icterus, pupils equal round and reactive, no palptable cervical adenopathy,  CV Resp: Clear to auscultation bilaterally GI: abdomen is soft, non-tender, non-distended, normal bowel sounds, no hepatosplenomegaly MSK: extremities are warm, no edema.  Skin: warm, no rash Neuro:  no focal deficits Psych: appropriate affect   Diagnostic Studies  04/2020 echo 1. Trabeculated apex consider f/u cardiac MRI r/o non compaction if  clinically indicated. Left ventricular ejection fraction, by estimation,  is 40 to 45%.  The left ventricle has mildly decreased function. The left  ventricle demonstrates global  hypokinesis. There is mild left ventricular hypertrophy. Left ventricular  diastolic parameters are indeterminate.   2. Right ventricular systolic function is normal. The right ventricular  size is normal.   3. Left atrial size was mildly dilated.   4. Right atrial size was mildly dilated.   5. The mitral valve is normal in structure. No evidence of mitral valve  regurgitation. No evidence of mitral stenosis.   6. The aortic valve is normal in structure. Aortic valve regurgitation is  not visualized. No aortic stenosis is present.   7. The inferior vena cava is normal in size with greater than 50%  respiratory variability, suggesting right atrial pressure  of 3 mmHg.        Assessment and Plan  1. Persistent afib - failed DCCV - doing well with rate control, continue current meds including xarelto.      2. Chronic sysotlic HF - thought to be tachy mediated initially at time of diagnosis, plan to control arrhythmia and follow LVEF, did not pursue cath at that time.  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 May consider in the future depending clinical course - tolerating optimal HF medication regimen all at goal doses - no recent symptoms - f/u 3 months, likely repeat echo after that visit      Arnoldo Lenis, M.D., F.A.C.C.

## 2021-06-27 ENCOUNTER — Other Ambulatory Visit: Payer: Self-pay | Admitting: Cardiology

## 2021-06-27 NOTE — Telephone Encounter (Signed)
Prescription refill request for Xarelto received.  ?Indication: Atrial Fib ?Last office visit: 11/03/20  Roy Abts MD ?Weight: 134.2kg ?Age: 56 ?Scr: 1.07 on 12/25/20 ?CrCl: 148.07 ? ?Based on above findings Xarelto '20mg'$  daily is the appropriate dose.  Refill approved. ? ?

## 2021-07-29 ENCOUNTER — Other Ambulatory Visit: Payer: Self-pay | Admitting: Cardiology

## 2021-08-22 ENCOUNTER — Other Ambulatory Visit: Payer: Self-pay | Admitting: Cardiology

## 2021-10-02 ENCOUNTER — Encounter: Payer: Self-pay | Admitting: Cardiology

## 2021-10-02 ENCOUNTER — Ambulatory Visit: Payer: 59 | Admitting: Cardiology

## 2021-10-02 VITALS — BP 90/64 | HR 78 | Ht 74.0 in | Wt 303.0 lb

## 2021-10-02 DIAGNOSIS — G4733 Obstructive sleep apnea (adult) (pediatric): Secondary | ICD-10-CM

## 2021-10-02 DIAGNOSIS — D6869 Other thrombophilia: Secondary | ICD-10-CM | POA: Diagnosis not present

## 2021-10-02 DIAGNOSIS — I5022 Chronic systolic (congestive) heart failure: Secondary | ICD-10-CM

## 2021-10-02 DIAGNOSIS — I4819 Other persistent atrial fibrillation: Secondary | ICD-10-CM | POA: Diagnosis not present

## 2021-10-02 NOTE — Progress Notes (Signed)
Clinical Summary [name] is a 56 y.o.male seen today for follow up of the following medical problems.    1. Afib - 07/2018 2 week event monitor showed coarse afib vs aflutter with several RVR episodes, new diagnosis for patient at that time  - he did not follow up. We had planned to recheck EKG in a few weeks after our last visit and plan for DCCV if he was still in afib -further delay in cardioversion attempt awaiting [facility]   - s/p DCCV attempt 5/19, failed to convert to SR. Focusing on rate control strategy   - no palpitations, no bleeding on xarelto - compliant with meds   2. 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 Mild dizziness at times.    3. OSA - followed by Dr Elsworth Soho, moderate OSA. - awaiting cpap - chronic fatigue    4. Depression/anxiety Past Medical History:  Diagnosis Date   Anal fissure    Anxiety    Asthma    "mild" per patient. inhaler prn   Cancer (HCC)    right shoulder melanoma   COPD (chronic obstructive pulmonary disease) ([place])    "mild" per patient, no smoking history, possibly due to working in a [employer]   Depression    GERD (gastroesophageal reflux disease)    H/O hiatal hernia    History of kidney stones    Hypertension    Obesity (BMI 30-39.9) AUG 2013 266 LBS   FEB 2015 284 LBS     No Known Allergies   Current Outpatient Medications  Medication Sig Dispense Refill   acetaminophen (TYLENOL) 500 MG tablet Take 1,000 mg by mouth every 6 (six) hours as needed for moderate pain.      albuterol (PROAIR HFA) 108 (90 Base) MCG/ACT inhaler Inhale 1 puff into the lungs every 6 (six) hours as needed for wheezing or  shortness of breath. 3.7 g 1   ENTRESTO 97-103 MG TAKE 1 TABLET BY MOUTH TWICE DAILY 180 tablet 3   escitalopram (LEXAPRO) 20 MG tablet Take 20 mg by mouth daily.     FARXIGA 10 MG TABS tablet TAKE 1 TABLET BY MOUTH DAILY BEFORE breakfast 90 tablet 3   Magnesium 250 MG TABS Take 250 mg by mouth daily.      metoprolol succinate (TOPROL-XL) 100 MG 24 hr tablet TAKE 1 TABLET BY MOUTH EVERY MORNING and TAKE 1 TABLET AT BEDTIME WITH OR IMMEDIATELY FOLLOWING A MEAL 60 tablet 2   Omega-3 Fatty Acids (FISH OIL) 1000 MG CAPS Take by mouth.     rivaroxaban (XARELTO) 20 MG TABS tablet TAKE 1 TABLET BY MOUTH EVERY DAY WITH SUPPER 30 tablet 11   spironolactone (ALDACTONE) 25 MG tablet TAKE 1 TABLET BY MOUTH DAILY 90 tablet 3   tamsulosin (FLOMAX) 0.4 MG CAPS capsule Take 0.4 mg by mouth daily. (Patient not taking: Reported on 11/03/2020)     Vitamin D, Cholecalciferol, 25 MCG (1000 UT) TABS Take 1,000 Units by mouth daily.      No current facility-administered medications for this visit.     Past Surgical History:  Procedure Laterality Date   anterior cervical disectomy     APPENDECTOMY  CARDIOVERSION N/A 07/21/2019   Procedure: CARDIOVERSION;  Surgeon: Arnoldo Lenis, MD;  [facility];  Service: Endoscopy;  Laterality: N/A;   CIRCUMCISION     COLONOSCOPY  11/21/2011   MOD IH     DG ARTHRO THUMB*L*     removal of cyst   FLEXIBLE SIGMOIDOSCOPY N/A 04/12/2013   HEMRRHOID BANDING X3.    HEMORRHOID BANDING N/A 04/12/2013   Procedure: Thayer Jew;  Surgeon: Danie Binder, MD;  [facility];  Service: Endoscopy;  Laterality: N/A;   NERVE, TENDON AND ARTERY REPAIR Left 02/21/2017   Procedure: WOUND EXPLORATION LEFT INDEX, LONG, AND RING FINGERS;  NERVE, TENDON AND ARTERY REPAIR;  Surgeon: Leanora Cover, MD;  Location: [place];  Service: Orthopedics;  Laterality: Left;     No Known Allergies    Family History  Problem Relation Age of Onset   Liver  disease Brother        ? etiology     Social History [name] reports that he has never smoked. He has never used smokeless tobacco. [name] reports that he does not currently use alcohol.   Review of Systems CONSTITUTIONAL: +fatigue.  HEENT: Eyes: No visual loss, blurred vision, double vision or yellow sclerae.No hearing loss, sneezing, congestion, runny nose or sore throat.  SKIN: No rash or itching.  CARDIOVASCULAR: per hpi RESPIRATORY: No shortness of breath, cough or sputum.  GASTROINTESTINAL: No anorexia, nausea, vomiting or diarrhea. No abdominal pain or blood.  GENITOURINARY: No burning on urination, no polyuria NEUROLOGICAL: No headache, dizziness, syncope, paralysis, ataxia, numbness or tingling in the extremities. No change in bowel or bladder control.  MUSCULOSKELETAL: No muscle, back pain, joint pain or stiffness.  LYMPHATICS: No enlarged nodes. No history of splenectomy.  PSYCHIATRIC: No history of depression or anxiety.  ENDOCRINOLOGIC: No reports of sweating, cold or heat intolerance. No polyuria or polydipsia.  Marland Kitchen   Physical Examination Today's Vitals   10/02/21 1106  BP: 90/64  Pulse: 78  SpO2: 96%  Weight: (!) 303 lb (137.4 kg)  Height: '6\' 2"'$  (1.88 m)   Body mass index is 38.9 kg/m.  Gen: resting comfortably, no acute distress HEENT: no scleral icterus, pupils equal round and reactive, no palptable cervical adenopathy,  CV: RRR, n m/r/ gno jvd Resp: Clear to auscultation bilaterally GI: abdomen is soft, non-tender, non-distended, normal bowel sounds, no hepatosplenomegaly MSK: extremities are warm, no edema.  Skin: warm, no rash Neuro:  no focal deficits Psych: appropriate affect   Diagnostic Studies  04/2020 echo 1. Trabeculated apex consider f/u cardiac MRI r/o non compaction if  clinically indicated. Left ventricular ejection fraction, by estimation,  is 40 to 45%. The left ventricle has mildly decreased function. The left  ventricle  demonstrates global  hypokinesis. There is mild left ventricular hypertrophy. Left ventricular  diastolic parameters are indeterminate.   2. Right ventricular systolic function is normal. The right ventricular  size is normal.   3. Left atrial size was mildly dilated.   4. Right atrial size was mildly dilated.   5. The mitral valve is normal in structure. No evidence of mitral valve  regurgitation. No evidence of mitral stenosis.   6. The aortic valve is normal in structure. Aortic valve regurgitation is  not visualized. No aortic stenosis is present.   7. The inferior vena cava is normal in size with greater than 50%  respiratory variability, suggesting right atrial pressure of 3 mmHg.  Assessment and Plan  1. Persistent afib/acquired thrombophlia - failed DCCV - no symptoms, continue current meds including xarelto for stroke prevention     2. Chronic sysotlic HF - thought to be tachy mediated initially at time of diagnosis, plan to control arrhythmia and follow LVEF, did not pursue cath at that time.  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 May consider in the future depending clinical course - tolerating optimal HF medication regimen all at goal doses. Soft bp's but asymptomatic so will continue - repeat echo  3. OSA - failed to f/u with pulmonary , will refer again encouraged to keep appt.         Arnoldo Lenis, M.D.

## 2021-10-02 NOTE — Patient Instructions (Signed)
Medication Instructions:  Your physician recommends that you continue on your current medications as directed. Please refer to the Current Medication list given to you today.   Labwork: None  Testing/Procedures: Your physician has requested that you have an echocardiogram. Echocardiography is a painless test that uses sound waves to create images of your heart. It provides your doctor with information about the size and shape of your heart and how well your heart's chambers and valves are working. This procedure takes approximately one hour. There are no restrictions for this procedure.   Follow-Up:  Your physician recommends that you schedule a follow-up appointment in: Pending  Any Other Special Instructions Will Be Listed Below (If Applicable).  You have been referred to pulmonology  If you need a refill on your cardiac medications before your next appointment, please call your pharmacy.

## 2021-10-04 ENCOUNTER — Ambulatory Visit (INDEPENDENT_AMBULATORY_CARE_PROVIDER_SITE_OTHER): Payer: 59

## 2021-10-04 DIAGNOSIS — I5022 Chronic systolic (congestive) heart failure: Secondary | ICD-10-CM | POA: Diagnosis not present

## 2021-10-05 LAB — ECHOCARDIOGRAM COMPLETE
AR max vel: 3.35 cm2
AV Area VTI: 3.46 cm2
AV Area mean vel: 3.82 cm2
AV Mean grad: 1.7 mmHg
AV Peak grad: 3.5 mmHg
Ao pk vel: 0.94 m/s
Area-P 1/2: 4.15 cm2
Calc EF: 42.9 %
MV M vel: 1.95 m/s
MV Peak grad: 15.2 mmHg
S' Lateral: 4.67 cm
Single Plane A2C EF: 46.3 %
Single Plane A4C EF: 41.9 %

## 2021-10-10 ENCOUNTER — Telehealth: Payer: Self-pay | Admitting: Cardiology

## 2021-10-10 NOTE — Telephone Encounter (Signed)
Patient was returning a call. Please advise  

## 2021-10-15 ENCOUNTER — Telehealth: Payer: Self-pay | Admitting: Pulmonary Disease

## 2021-10-15 NOTE — Telephone Encounter (Signed)
ATC patient.  LMTCB. 

## 2021-10-16 NOTE — Telephone Encounter (Signed)
ATC patient. Closing encounter per triage protocol after 2 attempts to contact.

## 2021-10-22 ENCOUNTER — Telehealth: Payer: Self-pay | Admitting: Cardiology

## 2021-10-22 DIAGNOSIS — I5022 Chronic systolic (congestive) heart failure: Secondary | ICD-10-CM

## 2021-10-22 NOTE — Telephone Encounter (Signed)
Left message to return call 

## 2021-10-22 NOTE — Telephone Encounter (Signed)
Pt returning a call for results

## 2021-10-23 ENCOUNTER — Encounter: Payer: Self-pay | Admitting: *Deleted

## 2021-10-23 NOTE — Telephone Encounter (Signed)
Pt returning a call.

## 2021-10-23 NOTE — Telephone Encounter (Signed)
Laurine Blazer, LPN  03/20/4351  9:12 PM EDT Back to Top    Notified, copy to pcp.  He agrees to doing the limited echo.    Laurine Blazer, LPN  2/58/3462  1:94 PM EDT     Left message to return call.   Sung Amabile, CMA  10/10/2021  1:50 PM EDT     lmtcb   Arnoldo Lenis, MD  10/08/2021  3:25 PM EDT     Limited visualization on echo, needs limited study with echocontrast to better evaluate LV function and exclude any LV thrombus   Zandra Abts MD

## 2021-10-24 ENCOUNTER — Telehealth: Payer: Self-pay | Admitting: Cardiology

## 2021-10-24 NOTE — Telephone Encounter (Signed)
Checking percert on the following patient for testing scheduled at [facility] with contrast   10/31/2021

## 2021-10-29 ENCOUNTER — Other Ambulatory Visit (HOSPITAL_COMMUNITY): Payer: 59

## 2021-10-31 ENCOUNTER — Ambulatory Visit (HOSPITAL_COMMUNITY)
Admission: RE | Admit: 2021-10-31 | Discharge: 2021-10-31 | Disposition: A | Discharge status: Home / Self Care | Payer: 59 | Source: Ambulatory Visit | Attending: Cardiology | Admitting: Cardiology

## 2021-10-31 DIAGNOSIS — I5022 Chronic systolic (congestive) heart failure: Secondary | ICD-10-CM | POA: Diagnosis present

## 2021-10-31 MED ORDER — PERFLUTREN LIPID MICROSPHERE
1.0000 mL | INTRAVENOUS | Status: AC | PRN
  Administered 2021-10-31: 5 mL via INTRAVENOUS

## 2021-10-31 NOTE — Progress Notes (Signed)
*  PRELIMINARY RESULTS* Echocardiogram Limited 2-D Echocardiogram  has been performed with [facility].  [name] 10/31/2021, 10:05 AM

## 2021-11-04 ENCOUNTER — Other Ambulatory Visit: Payer: Self-pay | Admitting: Cardiology

## 2021-11-12 ENCOUNTER — Encounter: Payer: Self-pay | Admitting: *Deleted

## 2021-11-24 ENCOUNTER — Other Ambulatory Visit: Payer: Self-pay | Admitting: Cardiology

## 2021-12-19 ENCOUNTER — Ambulatory Visit: Payer: 59 | Admitting: Pulmonary Disease

## 2021-12-19 ENCOUNTER — Encounter: Payer: Self-pay | Admitting: Pulmonary Disease

## 2021-12-19 VITALS — BP 130/78 | HR 80 | Temp 97.7°F | Ht 74.0 in | Wt 301.0 lb

## 2021-12-19 DIAGNOSIS — J449 Chronic obstructive pulmonary disease, unspecified: Secondary | ICD-10-CM

## 2021-12-19 DIAGNOSIS — G4733 Obstructive sleep apnea (adult) (pediatric): Secondary | ICD-10-CM | POA: Diagnosis not present

## 2021-12-19 NOTE — Assessment & Plan Note (Signed)
Minimal obstruction, never smoker, doubt bronchodilators necessary here

## 2021-12-19 NOTE — Patient Instructions (Signed)
  X CPAP titration study

## 2021-12-19 NOTE — Assessment & Plan Note (Signed)
We will set him up for CPAP titration study.  I have asked him to take melatonin at night of the study and not nap the day before so that he can sleep during the study. Based on this, we will initiate CPAP therapy, he prefers a nasal mask  We reviewed home sleep test that shows predominant obstructives, few central apneas

## 2021-12-19 NOTE — Progress Notes (Signed)
   Subjective:    Patient ID: ION GONNELLA, male    DOB: 10-Mar-1965, 56 y.o.   MRN: [id number]  HPI  56 year old presents to reestablish care for OSA -works second shift at [employer]  PMH - atrial fibrillation s/p DCCV 07/2018 HfrEF - c mRI EF 38%, echo EF 40-45%  We last saw him 06/2019, home sleep test showed moderate OSA, we recommended CPAP but he did not take this seriously and was lost to follow-up.  He is now referred back by his cardiologist.  Reviewed cardiology consultation and recent testing He continues to complain of tiredness, he works a second shift so bedtime can be anywhere between 3 AM and 5 AM.  Snoring has been noted by his wife was also witnessed apneas He also complains of mild shortness of breath especially on exertion  Significant tests/ events reviewed    08/2019 HST  -298 lbs - showed moderate OSA-  AHI 28/h , low sat 78%, few centrals  PFTs 02/2014 showed mild airway obstruction with ratio of 68, FEV1 74%, FVC 85%, no bronchodilator response, TLC normal, DLCO 90%  Review of Systems  neg for any significant sore throat, dysphagia, itching, sneezing, nasal congestion or excess/ purulent secretions, fever, chills, sweats, unintended wt loss, pleuritic or exertional cp, hempoptysis, orthopnea pnd or change in chronic leg swelling. Also denies presyncope, palpitations, heartburn, abdominal pain, nausea, vomiting, diarrhea or change in bowel or urinary habits, dysuria,hematuria, rash, arthralgias, visual complaints, headache, numbness weakness or ataxia.     Objective:   Physical Exam  Gen. Pleasant, obese, in no distress ENT - no lesions, no post nasal drip Neck: No JVD, no thyromegaly, no carotid bruits Lungs: no use of accessory muscles, no dullness to percussion, decreased without rales or rhonchi  Cardiovascular: Rhythm regular, heart sounds  normal, no murmurs or gallops, no peripheral edema Musculoskeletal: No deformities, no cyanosis or clubbing , no  tremors        Assessment & Plan:

## 2022-02-10 ENCOUNTER — Other Ambulatory Visit: Payer: Self-pay | Admitting: Cardiology

## 2022-05-14 ENCOUNTER — Other Ambulatory Visit: Payer: Self-pay | Admitting: Cardiology

## 2022-06-11 IMAGING — DX DG CHEST 2V
2 series · 2 of 2 positions shown · non-contrast
Comparison: 05/05/2008

CLINICAL DATA: Chest pain diaphoresis

EXAM:
CHEST - 2 VIEW

[chest pa]
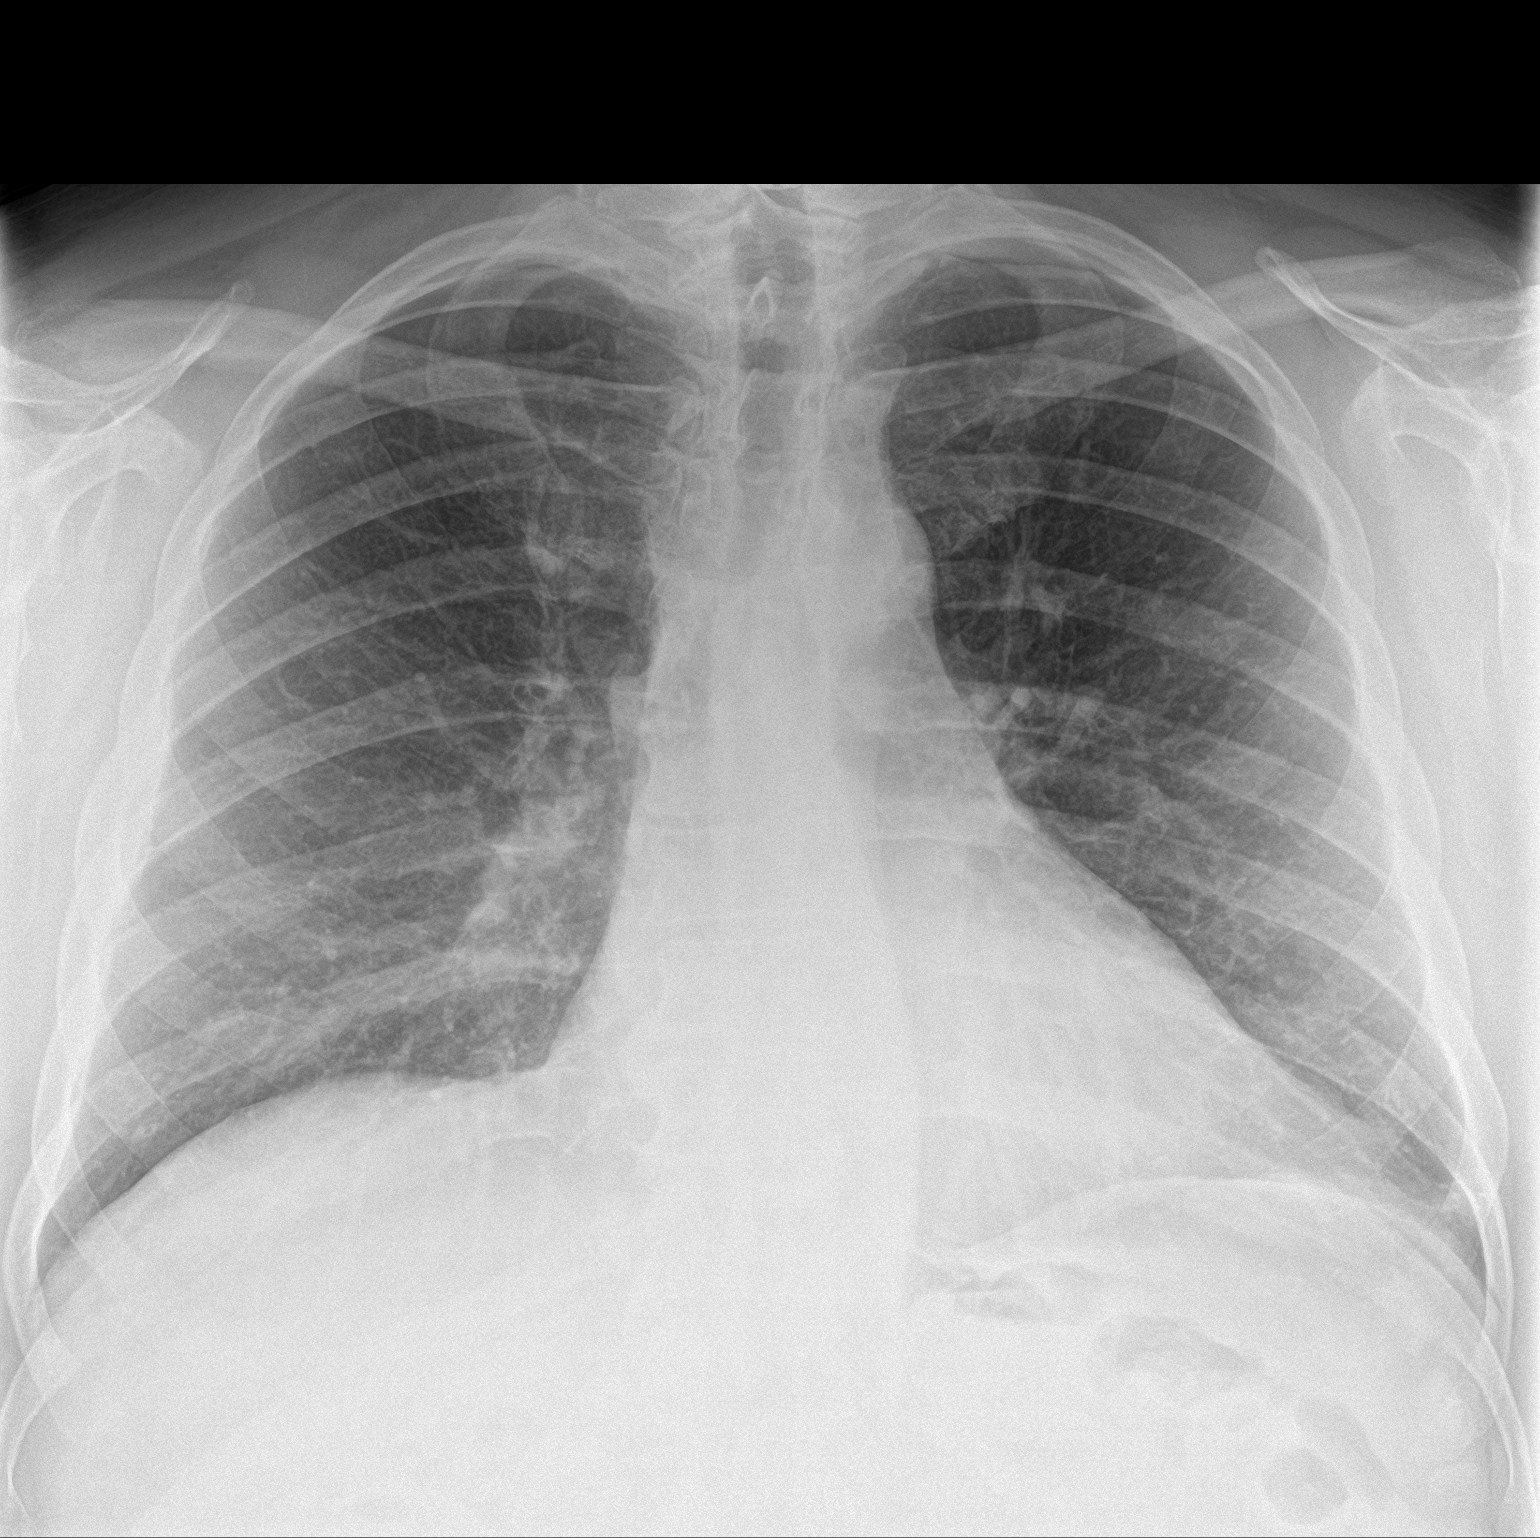

[chest lat]
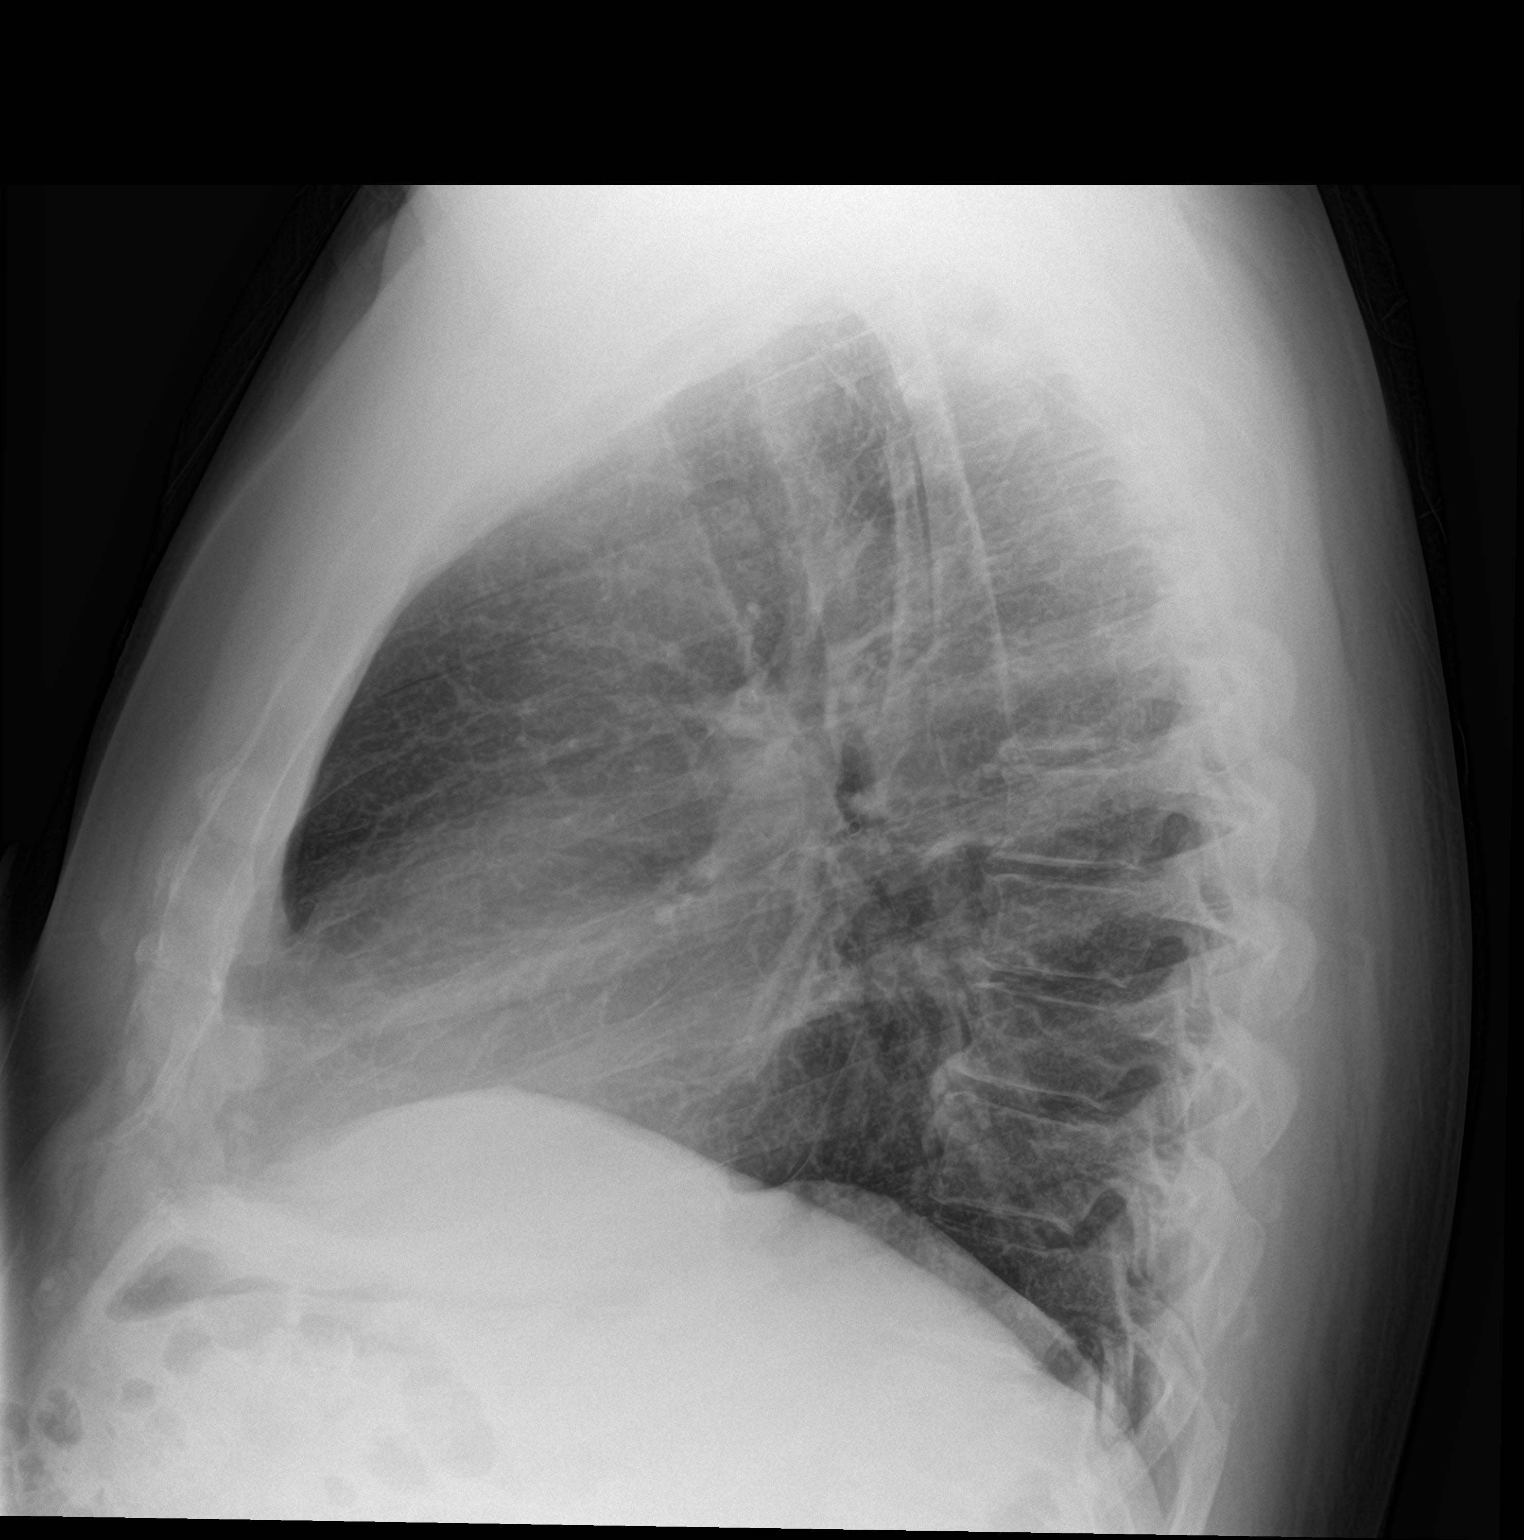

[2 of 2 positions shown; findings below may reference images not displayed]

FINDINGS: The heart size and mediastinal contours are within normal limits.
Both lungs are clear. The visualized skeletal structures are
unremarkable.
IMPRESSION: No active cardiopulmonary disease.

## 2022-06-12 ENCOUNTER — Encounter: Payer: Self-pay | Admitting: *Deleted

## 2022-06-14 ENCOUNTER — Telehealth: Payer: Self-pay | Admitting: Pulmonary Disease

## 2022-06-14 NOTE — Telephone Encounter (Signed)
Pt called the office stating that he has not heard anything about having sleep study scheduled. [facility], please advise on this for pt.

## 2022-06-20 NOTE — Telephone Encounter (Signed)
Giving it to [facility] for the auth/cb

## 2022-06-24 NOTE — Telephone Encounter (Signed)
Patient called just to check on status of HST. Please call and schedule when Auth is approved.  [phone]

## 2022-06-25 NOTE — Telephone Encounter (Signed)
Authorization obtained and given to [facility] to schedule. Nothing further needed in this message.

## 2022-07-14 ENCOUNTER — Ambulatory Visit (HOSPITAL_BASED_OUTPATIENT_CLINIC_OR_DEPARTMENT_OTHER): Payer: 59 | Attending: Pulmonary Disease | Admitting: Pulmonary Disease

## 2022-07-14 DIAGNOSIS — G4733 Obstructive sleep apnea (adult) (pediatric): Secondary | ICD-10-CM | POA: Diagnosis present

## 2022-07-15 ENCOUNTER — Other Ambulatory Visit: Payer: Self-pay | Admitting: Cardiology

## 2022-07-15 NOTE — Telephone Encounter (Signed)
Prescription refill request for Xarelto received.  Indication: AF Last office visit: 10/02/21  Dominga Ferry MD Weight: 137.4kg Age: 57 Scr: 1.07 on 12/25/20  Epic CrCl: 149.81  Based on above findings Xarelto 20mg  daily is the appropriate dose.  Pt is past due for lab work and appt with Dr Wyline Mood.  Message sent to schedulers to make appt.  Refill approved x 2.

## 2022-07-18 ENCOUNTER — Telehealth: Payer: Self-pay | Admitting: Pulmonary Disease

## 2022-07-18 DIAGNOSIS — G4733 Obstructive sleep apnea (adult) (pediatric): Secondary | ICD-10-CM

## 2022-07-18 NOTE — Procedures (Signed)
Patient Name: [name], [name] Date: 07/14/2022 Gender: Male D.O.B: 1965-07-09 Age (years): 5 Referring Provider: Cyril Mourning MD, ABSM Height (inches): 72 Interpreting Physician: Cyril Mourning MD, ABSM Weight (lbs): 295 RPSGT: Cherylann Parr BMI: 40 MRN: [id number] Neck Size: 18.00 <br> <br> CLINICAL INFORMATION The patient is referred for a CPAP titration to treat sleep apnea.    08/2019 HST  -298 lbs - showed moderate OSA-  AHI 28/h , low sat 78%, few centrals  SLEEP STUDY TECHNIQUE As per the AASM Manual for the Scoring of Sleep and Associated Events v2.3 (April 2016) with a hypopnea requiring 4% desaturations.  The channels recorded and monitored were frontal, central and occipital EEG, electrooculogram (EOG), submentalis EMG (chin), nasal and oral airflow, thoracic and abdominal wall motion, anterior tibialis EMG, snore microphone, electrocardiogram, and pulse oximetry. Continuous positive airway pressure (CPAP) was initiated at the beginning of the study and titrated to treat sleep-disordered breathing.  MEDICATIONS Medications self-administered by patient taken the night of the study : N/A  TECHNICIAN COMMENTS Comments added by technician: Patient had difficulty initiating sleep. Comments added by scorer: N/A RESPIRATORY PARAMETERS Optimal PAP Pressure (cm): 12 AHI at Optimal Pressure (/hr): 2.1 Overall Minimal O2 (%): 86.0 Supine % at Optimal Pressure (%): 25 Minimal O2 at Optimal Pressure (%): 89.0   SLEEP ARCHITECTURE The study was initiated at 10:52:33 PM and ended at 4:49:06 AM.  Sleep onset time was 23.2 minutes and the sleep efficiency was 80.7%. The total sleep time was 287.8 minutes.  The patient spent 5.9% of the night in stage N1 sleep, 85.1% in stage N2 sleep, 0.0% in stage N3 and 9% in REM.Stage REM latency was 95.0 minutes  Wake after sleep onset was 45.5. Alpha intrusion was absent. Supine sleep was 13.27%.  CARDIAC DATA The 2 lead EKG demonstrated  sinus rhythm. The mean heart rate was 78.0 beats per minute. Other EKG findings include: None.   LEG MOVEMENT DATA The total Periodic Limb Movements of Sleep (PLMS) were 0. The PLMS index was 0.0. A PLMS index of <15 is considered normal in adults.  IMPRESSIONS - The optimal PAP pressure was 12 cm of water. - Central sleep apnea was not noted during this titration (CAI = 0.2/h). - Moderate oxygen desaturations were observed during this titration (min O2 = 86.0%). - No snoring was audible during this study. - No cardiac abnormalities were observed during this study. - Clinically significant periodic limb movements were not noted during this study. Arousals associated with PLMs were rare.   DIAGNOSIS - Obstructive Sleep Apnea (G47.33)   RECOMMENDATIONS - Trial of CPAP therapy on 12 cm H2O with a Large size Fisher&Paykel Full Face Simplus mask and heated humidification. - Avoid alcohol, sedatives and other CNS depressants that may worsen sleep apnea and disrupt normal sleep architecture. - Sleep hygiene should be reviewed to assess factors that may improve sleep quality. - Weight management and regular exercise should be initiated or continued. - Return to [facility] Center for re-evaluation after 4 weeks of therapy   Cyril Mourning MD Board Certified in Sleep medicine

## 2022-07-18 NOTE — Telephone Encounter (Signed)
Rx of CPAP 12 cm with lg FF mask to DME Please let pt knowOV in 6 wks after starting

## 2022-07-19 NOTE — Telephone Encounter (Signed)
Called and spoke w/ pt he verbalized understanding of RA - CPAP order has been placed

## 2022-07-25 ENCOUNTER — Ambulatory Visit: Payer: 59 | Admitting: Nurse Practitioner

## 2022-07-26 ENCOUNTER — Encounter: Payer: Self-pay | Admitting: Nurse Practitioner

## 2022-07-26 ENCOUNTER — Ambulatory Visit: Payer: 59 | Attending: Nurse Practitioner | Admitting: Nurse Practitioner

## 2022-07-26 VITALS — BP 110/70 | HR 72 | Ht 74.0 in | Wt 300.4 lb

## 2022-07-26 DIAGNOSIS — G8929 Other chronic pain: Secondary | ICD-10-CM

## 2022-07-26 DIAGNOSIS — G4733 Obstructive sleep apnea (adult) (pediatric): Secondary | ICD-10-CM

## 2022-07-26 DIAGNOSIS — I10 Essential (primary) hypertension: Secondary | ICD-10-CM

## 2022-07-26 DIAGNOSIS — Z79899 Other long term (current) drug therapy: Secondary | ICD-10-CM | POA: Diagnosis not present

## 2022-07-26 DIAGNOSIS — I5022 Chronic systolic (congestive) heart failure: Secondary | ICD-10-CM | POA: Diagnosis not present

## 2022-07-26 DIAGNOSIS — I4819 Other persistent atrial fibrillation: Secondary | ICD-10-CM

## 2022-07-26 DIAGNOSIS — M546 Pain in thoracic spine: Secondary | ICD-10-CM

## 2022-07-26 NOTE — Patient Instructions (Addendum)
Medication Instructions:  Your physician recommends that you continue on your current medications as directed. Please refer to the Current Medication list given to you today.  Labwork: BMET & CBC today at [facility]  Testing/Procedures: none  Follow-Up: Your physician recommends that you schedule a follow-up appointment in: 3 months  Any Other Special Instructions Will Be Listed Below (If Applicable).  If you need a refill on your cardiac medications before your next appointment, please call your pharmacy.

## 2022-07-26 NOTE — Progress Notes (Signed)
Office Visit    Patient Name: [name] Date of Encounter: 07/26/2022  PCP:  Assunta Found, MD   [facility]  Cardiologist:  Dina Rich, MD  Advanced Practice Provider:  No care team member to display Electrophysiologist:  None   Chief Complaint    [name] is a 57 y.o. male with a hx of A-fib, s/p DCCV in 2019, HFmrEF, OSA, hypertension, obesity, depression/anxiety, who presents today for scheduled follow-up.    Past Medical History    Past Medical History:  Diagnosis Date   Anal fissure    Anxiety    Asthma    "mild" per patient. inhaler prn   Cancer (HCC)    right shoulder melanoma   COPD (chronic obstructive pulmonary disease) (HCC)    "mild" per patient, no smoking history, possibly due to working in a [employer]   Depression    GERD (gastroesophageal reflux disease)    H/O hiatal hernia    History of kidney stones    Hypertension    Obesity (BMI 30-39.9) AUG 2013 266 LBS   FEB 2015 284 LBS   Past Surgical History:  Procedure Laterality Date   anterior cervical disectomy     APPENDECTOMY     CARDIOVERSION N/A 07/21/2019   Procedure: CARDIOVERSION;  Surgeon: Antoine Poche, MD;  [facility]: [facility];  Service: Endoscopy;  Laterality: N/A;   CIRCUMCISION     COLONOSCOPY  11/21/2011   MOD IH     DG ARTHRO THUMB*L*     removal of cyst   FLEXIBLE SIGMOIDOSCOPY N/A 04/12/2013   HEMRRHOID BANDING X3.    HEMORRHOID BANDING N/A 04/12/2013   Procedure: [place];  Surgeon: West Bali, MD;  Location: [facility];  Service: Endoscopy;  Laterality: N/A;   NERVE, TENDON AND ARTERY REPAIR Left 02/21/2017   Procedure: WOUND EXPLORATION LEFT INDEX, LONG, AND RING FINGERS;  NERVE, TENDON AND ARTERY REPAIR;  Surgeon: Betha Loa, MD;  Location: [facility];  Service: Orthopedics;  Laterality: Left;    Allergies  No Known Allergies  History of Present Illness    [name] is a 57 y.o.  male with a PMH as mentioned above.  Previous cardiovascular history includes new diagnosis of A-fib/a flutter in 2020, seen on 2-week event monitor with several RVR episodes.  Underwent DCCV attempt in 2019, failed to convert to sinus rhythm.  In 2021, echocardiogram revealed EF 30 to 35%.  Cardiac MRI results from 2022 noted below.  EF at that time estimated to be 38%.  Last seen by Dr. Dina Rich on October 02, 2021.  Was recommended may need to consider cardiac catheterization depending on clinical course.  Echocardiogram was repeated and revealed EF mildly reduced at 40 to 45%.  Was referred again to pulmonology for history of OSA.  Today he presents for scheduled follow-up.  He states he is overall doing well.  Does have his good days and bad days.  Does admit to some fatigue, not sure if this is due to his A-fib.  He is scheduled to be receiving a CPAP machine, not sure when, and says he is going to be following up on this. Denies any chest pain, shortness of breath, palpitations, syncope, presyncope, dizziness, orthopnea, PND, swelling or significant weight changes, acute bleeding, or claudication.  Admits to rare sensation of pain located under right shoulder blade, says radiates to right side of upper abdomen.  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  Says he used to go to the chiropractor, but doesn't any more.   SH: Works for [employer] in [employer] Studies Reviewed:   The following studies were reviewed today:   EKG:  EKG is ordered today.  The ekg ordered today demonstrates A-fib, 72 bpm.   Limited echo 10/2021: IMPRESSIONS    1. Windows remain challenging. swirling of contrast is seen near the  apex. Left ventricular ejection fraction, by estimation, is 40 to 45%. The  left ventricle has mildly decreased function.   Conclusion(s)/Recommendation(s): Recommend considering cardiac MRI to r/o  apical thrombus if BMI permits.  Cardiac MRI  06/2020: IMPRESSION: 1.  Mild LVE with global hypokinesis quantitative EF 38%   2.  Findings consistent with ventricular non compaction   3.  Mild RVE with mild hypokinesis quantitative EF 42%   4.  No delayed enhancement of LV myocardium   5.  Mild appearing MR   6.  Tri leaflet AV with normal aortic root 3.0 cm mild appearing AR   7.  Mild bi atrial enlargement  Recent Labs: No results found for requested labs within last 365 days.  Recent Lipid Panel No results found for: "CHOL", "TRIG", "HDL", "CHOLHDL", "VLDL", "LDLCALC", "LDLDIRECT"  Risk Assessment/Calculations:   CHA2DS2-VASc Score = 2  This indicates a 2.2% annual risk of stroke. The patient's score is based upon: CHF History: 1 HTN History: 1 Diabetes History: 0 Stroke History: 0 Vascular Disease History: 0 Age Score: 0 Gender Score: 0    Home Medications   Current Meds  Medication Sig   acetaminophen (TYLENOL) 500 MG tablet Take 1,000 mg by mouth every 6 (six) hours as needed for moderate pain.    albuterol (PROAIR HFA) 108 (90 Base) MCG/ACT inhaler Inhale 1 puff into the lungs every 6 (six) hours as needed for wheezing or shortness of breath.   ENTRESTO 97-103 MG TAKE 1 TABLET BY MOUTH TWICE DAILY   escitalopram (LEXAPRO) 20 MG tablet Take 20 mg by mouth daily.   FARXIGA 10 MG TABS tablet TAKE 1 TABLET BY MOUTH DAILY BEFORE breakfast   levothyroxine (SYNTHROID) 50 MCG tablet Take 50 mcg by mouth daily.   Magnesium 250 MG TABS Take 250 mg by mouth daily.    metoprolol succinate (TOPROL-XL) 100 MG 24 hr tablet TAKE 1 TABLET BY MOUTH EVERY MORNING and TAKE 1 TABLET AT BEDTIME WITH OR IMMEDIATELY FOLLOWING A MEAL   Omega-3 Fatty Acids (FISH OIL) 1000 MG CAPS Take by mouth.   rivaroxaban (XARELTO) 20 MG TABS tablet TAKE 1 TABLET BY MOUTH EVERY DAY WITH SUPPER   spironolactone (ALDACTONE) 25 MG tablet TAKE 1 TABLET BY MOUTH DAILY   tamsulosin (FLOMAX) 0.4 MG CAPS capsule Take 0.4 mg by mouth daily.   Vitamin D,  Cholecalciferol, 25 MCG (1000 UT) TABS Take 1,000 Units by mouth daily.      Review of Systems    All other systems reviewed and are otherwise negative except as noted above.  Physical Exam    VS:  BP 110/70   Pulse 72   Ht 6\' 2"  (1.88 m)   Wt (!) 300 lb 6.4 oz (136.3 kg)   SpO2 97%   BMI 38.57 kg/m  , BMI Body mass index is 38.57 kg/m.  Wt Readings from Last 3 Encounters:  07/26/22 (!) 300 lb 6.4 oz (136.3 kg)  12/19/21 (!) 301 lb (136.5 kg)  10/02/21 (!) 303 lb (137.4 kg)  GEN: Obese, 57 year old male in no acute distress. HEENT: normal. Neck: Supple, no JVD, carotid bruits, or masses. Cardiac: S1/S2, regular rate and irregular rhythm, no murmurs, rubs, or gallops. No clubbing, cyanosis, edema.  Radials/PT 2+ and equal bilaterally.  Respiratory:  Respirations regular and unlabored, clear to auscultation bilaterally. GI: Soft, nontender, nondistended. MS: No deformity or atrophy. Skin: Warm and dry, no rash. Neuro:  Strength and sensation are intact. Psych: Normal affect.  Assessment & Plan    Persistent to permanent atrial fibrillation Heart rate well-controlled.  Denies any tachycardia or palpitations, does admit to fatigue-see below.  History of failed cardioversion in 2019.  Continue Toprol-XL and Xarelto.  He is currently on appropriate dosage of Xarelto, denies any bleeding issues. Heart healthy diet and regular cardiovascular exercise encouraged.  If no improvement by next follow-up after being set up for CPAP (see below), plan to refer to A-fib clinic for further evaluation.  2. HFmrEF, medication management Limited echo 10/2021 revealed EF estimated to be 40 to 45%.  Stage C, NYHA class I-II symptoms.  Continue Entresto, Farxiga, Toprol-XL, and spironolactone. Low sodium diet, fluid restriction <2L, and daily weights encouraged. Educated to contact our office for weight gain of 2 lbs overnight or 5 lbs in one week.  He is due for labs.  Will obtain CBC and BMET  per protocol.  OSA Has not obtained CPAP yet.  Recommended he follow-up regarding this.  He verbalized understanding.  Hypertension Blood pressure stable.  BP well-controlled at home. Discussed to monitor BP at home at least 2 hours after medications and sitting for 5-10 minutes.  No medication changes at this time. Heart healthy diet and regular cardiovascular exercise encouraged.   Obesity BMI 38.57. Weight loss via diet and exercise encouraged. Discussed the impact being overweight would have on cardiovascular risk.  Back pain Etiology most likely sounds musculoskeletal d/t job requirements.  States this pain does radiate to right side of upper abdomen.  Patient is not in acute distress, does not appear to be related to aneurysm/dissection.  No significant history of tobacco use.  Blood pressure well-controlled.  Offered/recommended to perform CT of chest; however, patient declined.  Stated he will discuss this with PCP, Dr. Phillips Odor.  Recommended Tylenol 1000 mg twice daily as needed for back pain.  ED precautions discussed. He verbalized understanding.   Disposition: Follow up in 3 month(s) with Dina Rich, MD or APP.  Signed, Sharlene Dory, NP 07/26/2022, 2:58 PM [facility]

## 2022-07-27 LAB — CBC
Hematocrit: 44.1 % (ref 37.5–51.0)
Hemoglobin: 14.7 g/dL (ref 13.0–17.7)
MCH: 29.8 pg (ref 26.6–33.0)
MCHC: 33.3 g/dL (ref 31.5–35.7)
MCV: 90 fL (ref 79–97)
Platelets: 192 10*3/uL (ref 150–450)
RBC: 4.93 x10E6/uL (ref 4.14–5.80)
RDW: 12.7 % (ref 11.6–15.4)
WBC: 4.7 10*3/uL (ref 3.4–10.8)

## 2022-07-27 LAB — BASIC METABOLIC PANEL
BUN/Creatinine Ratio: 11 (ref 9–20)
BUN: 12 mg/dL (ref 6–24)
CO2: 25 mmol/L (ref 20–29)
Calcium: 10.2 mg/dL (ref 8.7–10.2)
Chloride: 104 mmol/L (ref 96–106)
Creatinine, Ser: 1.05 mg/dL (ref 0.76–1.27)
Glucose: 93 mg/dL (ref 70–99)
Potassium: 4.3 mmol/L (ref 3.5–5.2)
Sodium: 144 mmol/L (ref 134–144)
eGFR: 83 mL/min/{1.73_m2} (ref >59)

## 2022-08-06 ENCOUNTER — Encounter: Payer: 59 | Admitting: Pulmonary Disease

## 2022-08-14 ENCOUNTER — Other Ambulatory Visit: Payer: Self-pay | Admitting: Cardiology

## 2022-09-15 ENCOUNTER — Other Ambulatory Visit: Payer: Self-pay | Admitting: Cardiology

## 2022-10-13 ENCOUNTER — Other Ambulatory Visit: Payer: Self-pay | Admitting: Cardiology

## 2022-10-14 NOTE — Telephone Encounter (Signed)
Prescription refill request for Xarelto received.  Indication: AF Last office visit: 07/26/22  Shawnie Dapper NP Weight: 136.3kg Age: 57 Scr: 1.05 on 07/26/22  Epic CrCl: 151.44  Based on above findings Xarelto 20mg  daily is the appropriate dose.  Refill approved.

## 2022-10-23 ENCOUNTER — Ambulatory Visit: Payer: 59 | Admitting: Nurse Practitioner

## 2022-11-13 ENCOUNTER — Other Ambulatory Visit: Payer: Self-pay | Admitting: Cardiology

## 2022-12-12 ENCOUNTER — Other Ambulatory Visit: Payer: Self-pay | Admitting: Cardiology

## 2022-12-18 ENCOUNTER — Emergency Department (HOSPITAL_COMMUNITY)
Admission: EM | Admit: 2022-12-18 | Discharge: 2022-12-18 | Discharge status: Against Medical Advice | Payer: 59 | Attending: Emergency Medicine | Admitting: Emergency Medicine

## 2022-12-18 ENCOUNTER — Encounter (HOSPITAL_COMMUNITY): Payer: Self-pay

## 2022-12-18 DIAGNOSIS — R5383 Other fatigue: Secondary | ICD-10-CM | POA: Diagnosis present

## 2022-12-18 DIAGNOSIS — M7918 Myalgia, other site: Secondary | ICD-10-CM | POA: Insufficient documentation

## 2022-12-18 DIAGNOSIS — Z5321 Procedure and treatment not carried out due to patient leaving prior to being seen by health care provider: Secondary | ICD-10-CM | POA: Insufficient documentation

## 2022-12-18 HISTORY — DX: Unspecified atrial fibrillation: I48.91

## 2022-12-18 LAB — BASIC METABOLIC PANEL
Anion gap: 6 (ref 5–15)
BUN: 13 mg/dL (ref 6–20)
CO2: 31 mmol/L (ref 22–32)
Calcium: 9.5 mg/dL (ref 8.9–10.3)
Chloride: 102 mmol/L (ref 98–111)
Creatinine, Ser: 1.08 mg/dL (ref 0.61–1.24)
GFR, Estimated: >60 mL/min (ref >60)
Glucose, Bld: 102 mg/dL — ABNORMAL HIGH (ref 70–99)
Potassium: 4.7 mmol/L (ref 3.5–5.1)
Sodium: 139 mmol/L (ref 135–145)

## 2022-12-18 LAB — CBC
HCT: 46.3 % (ref 39.0–52.0)
Hemoglobin: 15.3 g/dL (ref 13.0–17.0)
MCH: 30.5 pg (ref 26.0–34.0)
MCHC: 33 g/dL (ref 30.0–36.0)
MCV: 92.4 fL (ref 80.0–100.0)
Platelets: 222 10*3/uL (ref 150–400)
RBC: 5.01 MIL/uL (ref 4.22–5.81)
RDW: 13 % (ref 11.5–15.5)
WBC: 5.7 10*3/uL (ref 4.0–10.5)
nRBC: 0 % (ref 0.0–0.2)

## 2022-12-18 NOTE — ED Triage Notes (Signed)
Pt c/o increasing fatigue and generalized body aches x2 days.  Pt reports going to UC and told he had an abnormal EKG.  Denies chest pain and SOB.  Hx of A Fib and complaint w/ medication.       Pt reports testing negative for COVID and the flu.    Pt reports he has not been using his C Pap at night.

## 2022-12-18 NOTE — ED Notes (Signed)
Pt reported to Registration that he was leaving.  Pt encouraged to stay.

## 2022-12-23 ENCOUNTER — Telehealth: Payer: Self-pay | Admitting: Cardiology

## 2022-12-23 NOTE — Telephone Encounter (Signed)
Pt called in stating he was in the ED and asked if Dr. Wyline Mood can look over his EKG because he was told it was abnormal.

## 2022-12-25 NOTE — Telephone Encounter (Signed)
EKG just shows afib with with normal heart rates, nothing new or worrisome for him     Dominga Ferry MD  Left detailed message per DPR.

## 2022-12-25 NOTE — Telephone Encounter (Signed)
EKG just shows afib with with normal heart rates, nothing new or worrisome for him   Dominga Ferry MD

## 2022-12-30 ENCOUNTER — Other Ambulatory Visit: Payer: Self-pay | Admitting: Cardiology

## 2023-01-25 ENCOUNTER — Other Ambulatory Visit: Payer: Self-pay | Admitting: Cardiology

## 2023-02-04 ENCOUNTER — Telehealth: Payer: Self-pay | Admitting: Cardiology

## 2023-02-04 NOTE — Telephone Encounter (Signed)
Pt's wife wants to discuss pt's afib and depression with Dr Wyline Mood.

## 2023-02-05 ENCOUNTER — Ambulatory Visit: Payer: 59 | Attending: Nurse Practitioner

## 2023-02-05 ENCOUNTER — Encounter: Payer: Self-pay | Admitting: Nurse Practitioner

## 2023-02-05 ENCOUNTER — Other Ambulatory Visit: Payer: Self-pay | Admitting: Nurse Practitioner

## 2023-02-05 ENCOUNTER — Ambulatory Visit: Payer: 59 | Attending: Nurse Practitioner | Admitting: Nurse Practitioner

## 2023-02-05 VITALS — BP 108/68 | HR 61 | Ht 74.0 in | Wt 310.0 lb

## 2023-02-05 DIAGNOSIS — E669 Obesity, unspecified: Secondary | ICD-10-CM

## 2023-02-05 DIAGNOSIS — I480 Paroxysmal atrial fibrillation: Secondary | ICD-10-CM

## 2023-02-05 DIAGNOSIS — I4819 Other persistent atrial fibrillation: Secondary | ICD-10-CM

## 2023-02-05 DIAGNOSIS — I10 Essential (primary) hypertension: Secondary | ICD-10-CM | POA: Diagnosis not present

## 2023-02-05 DIAGNOSIS — R5383 Other fatigue: Secondary | ICD-10-CM

## 2023-02-05 DIAGNOSIS — G4733 Obstructive sleep apnea (adult) (pediatric): Secondary | ICD-10-CM

## 2023-02-05 DIAGNOSIS — I5022 Chronic systolic (congestive) heart failure: Secondary | ICD-10-CM

## 2023-02-05 MED ORDER — ENTRESTO 49-51 MG PO TABS: 1.0000 | ORAL_TABLET | Freq: Two times a day (BID) | ORAL | 5 refills | Status: DC

## 2023-02-05 NOTE — Progress Notes (Signed)
Office Visit    Patient Name: [name] Date of Encounter: 02/05/2023 PCP:  Assunta Found, MD [facility]  Cardiologist:  Dina Rich, MD  Advanced Practice Provider:  No care team member to display Electrophysiologist:  None   Chief Complaint and HPI    [name] is a 57 y.o. male with a hx of A-fib, s/p DCCV in 2019, HFmrEF, OSA, hypertension, obesity, depression/anxiety, who presents today for scheduled follow-up.    Previous cardiovascular history includes new diagnosis of A-fib/a flutter in 2020, seen on 2-week event monitor with several RVR episodes.  Underwent DCCV attempt in 2019, failed to convert to sinus rhythm.  In 2021, echocardiogram revealed EF 30 to 35%.  Cardiac MRI results from 2022 noted below.  EF at that time estimated to be 38%.  Last seen by Dr. Dina Rich on October 02, 2021.  Was recommended may need to consider cardiac catheterization depending on clinical course.  Echocardiogram was repeated and revealed EF mildly reduced at 40 to 45%.  Was referred again to pulmonology for history of OSA.  Today he presents for scheduled follow-up.  He states he is overall doing well.  Does have his good days and bad days.  Does admit to some fatigue, not sure if this is due to his A-fib.  He is scheduled to be receiving a CPAP machine, not sure when, and says he is going to be following up on this. Denies any chest pain, shortness of breath, palpitations, syncope, presyncope, dizziness, orthopnea, PND, swelling or significant weight changes, acute bleeding, or claudication.  Admits to rare sensation of pain located under right shoulder blade, says radiates to right side of upper abdomen.  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  Says he used to go to the chiropractor, but doesn't any more.   Wife recently contacted office noting concerns for A-fib and depression. Concerned about weakness, has been in and out of  A-fib for a while, nothing has changed. Today he presents to our office.  His chief concern is feeling fatigued and weak, notices occasional palpitations.  Does endorse weight gain due to not being as active.  Unable to tolerate CPAP mask for sleep apnea, looking into the inspire device.  Has had a few occasions of skipping Xarelto dose.  Denies any chest pain, shortness of breath, syncope, presyncope, dizziness, orthopnea, PND, swelling, acute bleeding, or claudication.  Denies any SI/HI.  SH: Works for [employer] in [employer] Studies Reviewed:   The following studies were reviewed today:   EKG:  EKG is ordered today.  The ekg ordered today demonstrates A-fib, 124 bpm, nonspecific T wave abnormality.  Limited echo 10/2021: IMPRESSIONS    1. Windows remain challenging. swirling of contrast is seen near the  apex. Left ventricular ejection fraction, by estimation, is 40 to 45%. The  left ventricle has mildly decreased function.   Conclusion(s)/Recommendation(s): Recommend considering cardiac MRI to r/o  apical thrombus if BMI permits.  Cardiac MRI 06/2020: IMPRESSION: 1.  Mild LVE with global hypokinesis quantitative EF 38%   2.  Findings consistent with ventricular non compaction   3.  Mild RVE with mild hypokinesis quantitative EF 42%   4.  No delayed enhancement of LV myocardium   5.  Mild appearing MR   6.  Tri leaflet AV with normal aortic root 3.0 cm mild appearing AR   7.  Mild bi atrial enlargement  Recent Labs:  12/18/2022: BUN 13; Creatinine, Ser 1.08; Hemoglobin 15.3; Platelets 222; Potassium 4.7; Sodium 139  Recent Lipid Panel No results found for: "CHOL", "TRIG", "HDL", "CHOLHDL", "VLDL", "LDLCALC", "LDLDIRECT"  Risk Assessment/Calculations:   CHA2DS2-VASc Score = 2  This indicates a 2.2% annual risk of stroke. The patient's score is based upon: CHF History: 1 HTN History: 1 Diabetes History: 0 Stroke History: 0 Vascular Disease History: 0 Age  Score: 0 Gender Score: 0    Review of Systems    All other systems reviewed and are otherwise negative except as noted above.  Physical Exam    VS:  BP 108/68   Pulse 61   Ht 6\' 2"  (1.88 m)   Wt (!) 310 lb (140.6 kg)   SpO2 98%   BMI 39.80 kg/m  , BMI Body mass index is 39.8 kg/m.  Wt Readings from Last 3 Encounters:  02/05/23 (!) 310 lb (140.6 kg)  12/18/22 (!) 309 lb (140.2 kg)  07/26/22 (!) 300 lb 6.4 oz (136.3 kg)     GEN: Obese, 57 year old male in no acute distress. HEENT: normal. Neck: Supple, no JVD, carotid bruits, or masses. Cardiac: S1/S2, regular rate and irregular rhythm, no murmurs, rubs, or gallops. No clubbing, cyanosis, edema.  Radials/PT 2+ and equal bilaterally.  Respiratory:  Respirations regular and unlabored, clear to auscultation bilaterally. GI: Soft, nontender, nondistended. MS: No deformity or atrophy. Skin: Warm and dry, no rash. Neuro:  Strength and sensation are intact. Psych: Flat, withdrawn affect, appears fatigued.  Assessment & Plan    Persistent to permanent atrial fibrillation Heart rate normal on exam.  Does admit to occasional palpitations, admits to fatigue-see below.  History of failed cardioversion in 2019.  Continue Toprol-XL and Xarelto.  He is currently on appropriate dosage of Xarelto, denies any bleeding issues. Heart healthy diet and regular cardiovascular exercise encouraged. Will arrange monitor for further review of his A-fib.  Depending on his heart rate trends, he will depend on titration of metoprolol.  Discussed compliance of Xarelto.  At next visit we will go over monitor results and go over other options for A-fib. Recommended Kardia mobile.  2. HFmrEF Limited echo 10/2021 revealed EF estimated to be 40 to 45%.  Stage C, NYHA class I-II symptoms.  Continue Farxiga, Toprol-XL, and spironolactone. Will reduce Entresto to 49/51 bpm. Low sodium diet, fluid restriction <2L, and daily weights encouraged. Educated to contact our  office for weight gain of 2 lbs overnight or 5 lbs in one week.  He is due for labs.    OSA Cannot tolerate CPAP machine.  Offered/recommended to refer to sleep services to get him set up for sleep mask, however he declines.  He is looking an inspire device.  Hypertension Blood pressure stable.  BP well-controlled at home. Discussed to monitor BP at home at least 2 hours after medications and sitting for 5-10 minutes.  No medication changes at this time. Heart healthy diet and regular cardiovascular exercise encouraged.   Obesity Weight loss via diet and exercise encouraged. Discussed the impact being overweight would have on cardiovascular risk.  Fatigue  Etiology multifactorial.  Does show some signs also of depression noted on exam.  Denies any SI/HI.  Arranging workup as previously mentioned.  Will also arrange echocardiogram for further evaluation for fatigue.  Recommended to also follow-up with PCP regarding fatigue.    Disposition: Follow up in 6 weeks with Dina Rich, MD or APP.  Signed, Sharlene Dory, NP

## 2023-02-05 NOTE — Patient Instructions (Addendum)
Medication Instructions:  Your physician has recommended you make the following change in your medication:  Please reduce Entresto to 49/51   Labwork: None   Testing/Procedures: Your physician has recommended that you wear a Zio monitor.   This monitor is a medical device that records the heart's electrical activity. Doctors most often use these monitors to diagnose arrhythmias. Arrhythmias are problems with the speed or rhythm of the heartbeat. The monitor is a small device applied to your chest. You can wear one while you do your normal daily activities. While wearing this monitor if you have any symptoms to push the button and record what you felt. Once you have worn this monitor for the period of time provider prescribed (for 7 days), you will return the monitor device in the postage paid box. Once it is returned they will download the data collected and provide [place] with a report which the provider will then review and we will call you with those results. Important tips:  Avoid showering during the first 24 hours of wearing the monitor. Avoid excessive sweating to help maximize wear time. Do not submerge the device, no hot tubs, and no swimming pools. Keep any lotions or oils away from the patch. After 24 hours you may shower with the patch on. Take brief showers with your back facing the shower head.  Do not remove patch once it has been placed because that will interrupt data and decrease adhesive wear time. Push the button when you have any symptoms and write down what you were feeling. Once you have completed wearing your monitor, remove and place into box which has postage paid and place in your outgoing mailbox.  If for some reason you have misplaced your box then call our office and we can provide another box and/or mail it off for you.  Your physician has requested that you have an echocardiogram. Echocardiography is a painless test that uses sound waves to create images of your  heart. It provides your doctor with information about the size and shape of your heart and how well your heart's chambers and valves are working. This procedure takes approximately one hour. There are no restrictions for this procedure. Please do NOT wear cologne, perfume, aftershave, or lotions (deodorant is allowed). Please arrive 15 minutes prior to your appointment time.  Please note: We ask at that you not bring children with you during ultrasound (echo/ vascular) testing. Due to room size and safety concerns, children are not allowed in the ultrasound rooms during exams. Our front office staff cannot provide observation of children in our lobby area while testing is being conducted. An adult accompanying a patient to their appointment will only be allowed in the ultrasound room at the discretion of the ultrasound technician under special circumstances. We apologize for any inconvenience.  Follow-Up: Your physician recommends that you schedule a follow-up appointment in: 6 weeks   Any Other Special Instructions Will Be Listed Below (If Applicable).  If you need a refill on your cardiac medications before your next appointment, please call your pharmacy.

## 2023-02-05 NOTE — Telephone Encounter (Signed)
Spoke with wife regarding concerns of Afib and depression. Needing to know what steps can be taken next for his heart. Concerned about weakness and has been in afib for awhile now and nothing has changed. Patient is being seen in office today. Relayed concerns to [place] to be addressed in appointment.

## 2023-02-07 DIAGNOSIS — I480 Paroxysmal atrial fibrillation: Secondary | ICD-10-CM | POA: Diagnosis not present

## 2023-02-12 ENCOUNTER — Telehealth: Payer: Self-pay | Admitting: Cardiology

## 2023-02-12 MED ORDER — SPIRONOLACTONE 25 MG PO TABS: 25.0000 mg | ORAL_TABLET | Freq: Every day | ORAL | 2 refills | Status: DC

## 2023-02-12 NOTE — Telephone Encounter (Signed)
*  STAT* If patient is at the pharmacy, call can be transferred to refill team.   1. Which medications need to be refilled? (please list name of each medication and dose if known) spironolactone (ALDACTONE) 25 MG tablet   2. Which pharmacy/location (including street and city if local pharmacy) is medication to be sent to?  [facility] - [place], [place] - [address]    3. Do they need a 30 day or 90 day supply? 90

## 2023-02-12 NOTE — Telephone Encounter (Signed)
Refill sent.

## 2023-02-18 ENCOUNTER — Ambulatory Visit: Payer: 59 | Attending: Nurse Practitioner

## 2023-02-18 DIAGNOSIS — R5383 Other fatigue: Secondary | ICD-10-CM

## 2023-02-18 DIAGNOSIS — I4819 Other persistent atrial fibrillation: Secondary | ICD-10-CM

## 2023-02-18 DIAGNOSIS — I5022 Chronic systolic (congestive) heart failure: Secondary | ICD-10-CM

## 2023-02-18 MED ORDER — PERFLUTREN LIPID MICROSPHERE
1.0000 mL | INTRAVENOUS | Status: AC | PRN
Start: 2023-02-18 — End: 2023-02-18
  Administered 2023-02-18: 10 mL via INTRAVENOUS

## 2023-02-19 LAB — ECHOCARDIOGRAM COMPLETE
AR max vel: 3.87 cm2
AV Area VTI: 3.23 cm2
AV Area mean vel: 3.6 cm2
AV Mean grad: 2 mm[Hg]
AV Peak grad: 3.3 mm[Hg]
Ao pk vel: 0.91 m/s
Calc EF: 47.4 %
MV VTI: 3.1 cm2
S' Lateral: 4.8 cm
Single Plane A2C EF: 56 %
Single Plane A4C EF: 45.2 %

## 2023-03-20 ENCOUNTER — Encounter: Payer: Self-pay | Admitting: Nurse Practitioner

## 2023-03-20 ENCOUNTER — Ambulatory Visit: Payer: 59 | Attending: Nurse Practitioner | Admitting: Nurse Practitioner

## 2023-03-20 VITALS — BP 120/68 | HR 76 | Wt 305.0 lb

## 2023-03-20 DIAGNOSIS — I10 Essential (primary) hypertension: Secondary | ICD-10-CM | POA: Diagnosis not present

## 2023-03-20 DIAGNOSIS — G4733 Obstructive sleep apnea (adult) (pediatric): Secondary | ICD-10-CM | POA: Diagnosis not present

## 2023-03-20 DIAGNOSIS — I5022 Chronic systolic (congestive) heart failure: Secondary | ICD-10-CM

## 2023-03-20 DIAGNOSIS — E669 Obesity, unspecified: Secondary | ICD-10-CM

## 2023-03-20 DIAGNOSIS — I4819 Other persistent atrial fibrillation: Secondary | ICD-10-CM | POA: Diagnosis not present

## 2023-03-20 NOTE — Patient Instructions (Addendum)
Medication Instructions:  Your physician recommends that you continue on your current medications as directed. Please refer to the Current Medication list given to you today.  Labwork: None   Testing/Procedures: None   Follow-Up: Your physician recommends that you schedule a follow-up appointment in: 4-6 weeks   Any Other Special Instructions Will Be Listed Below (If Applicable).  If you need a refill on your cardiac medications before your next appointment, please call your pharmacy.

## 2023-03-20 NOTE — Progress Notes (Signed)
Office Visit    Patient Name: [name] Date of Encounter: 03/20/2023 PCP:  Assunta Found, MD [facility]  Cardiologist:  Dina Rich, MD  Advanced Practice Provider:  No care team member to display Electrophysiologist:  None   Chief Complaint and HPI    [name] is a 58 y.o. male with a hx of A-fib, s/p DCCV in 2019, HFmrEF, OSA, hypertension, obesity, depression/anxiety, who presents today for scheduled follow-up.    Previous cardiovascular history includes new diagnosis of A-fib/a flutter in 2020, seen on 2-week event monitor with several RVR episodes.  Underwent DCCV attempt in 2019, failed to convert to sinus rhythm.  In 2021, echocardiogram revealed EF 30 to 35%.  Cardiac MRI results from 2022 noted below.  EF at that time estimated to be 38%.  Last seen by Dr. Dina Rich on October 02, 2021.  Was recommended may need to consider cardiac catheterization depending on clinical course.  Echocardiogram was repeated and revealed EF mildly reduced at 40 to 45%.  Was referred again to pulmonology for history of OSA.  02/05/2023 - Wife recently contacted office noting concerns for A-fib and depression. Concerned about weakness, has been in and out of A-fib for a while, nothing has changed. Today he presents to our office.  His chief concern is feeling fatigued and weak, notices occasional palpitations.  Does endorse weight gain due to not being as active.  Unable to tolerate CPAP mask for sleep apnea, looking into the inspire device.  Has had a few occasions of skipping Xarelto dose.  Denies any chest pain, shortness of breath, syncope, presyncope, dizziness, orthopnea, PND, swelling, acute bleeding, or claudication.  Denies any SI/HI. See monitor report below.   03/20/2023 - Today he presents for follow-up. Overall doing well and denies any overt sense of tachycardia or significant palpitations.  Denies any chest pain, shortness of breath, syncope,  presyncope, dizziness, orthopnea, PND, swelling or significant weight changes, acute bleeding, or claudication.  SH: Works for [employer] in [employer] Studies Reviewed:   The following studies were reviewed today:   EKG:  EKG is not ordered today.  Cardiac monitor 02/2023 (preliminary report):  Patch Wear Time:  8 days and 20 hours (2024-12-06T14:53:41-0500 to 2024-12-15T11:25:41-0500)   Atrial Fibrillation occurred continuously (100% burden), ranging from 50-218 bpm (avg of 113 bpm). Some episodes of Atrial Fibrillation conducted with possible aberrancy. Isolated VEs were rare (<1.0%), VE Couplets were rare (<1.0%), and no VE Triplets  were present.  Echo 02/2023:  1. Left ventricular ejection fraction, by estimation, is 45 to 50%. The  left ventricle has mildly decreased function. Left ventricular endocardial  border not optimally defined to evaluate regional wall motion. The left  ventricular internal cavity size  was moderately dilated. Left ventricular diastolic function could not be  evaluated.   2. Right ventricular systolic function is mild to moderately reduced. The  right ventricular size is moderately enlarged. Tricuspid regurgitation  signal is inadequate for assessing PA pressure.   3. Right atrial size was mildly dilated.   4. The mitral valve is normal in structure. No evidence of mitral valve  regurgitation. No evidence of mitral stenosis.   5. The aortic valve is tricuspid. Aortic valve regurgitation is not  visualized. No aortic stenosis is present.  Limited echo 10/2021: IMPRESSIONS    1. Windows remain challenging. swirling of contrast is seen near the  apex. Left ventricular ejection fraction, by estimation, is 40 to 45%.  The  left ventricle has mildly decreased function.   Conclusion(s)/Recommendation(s): Recommend considering cardiac MRI to r/o  apical thrombus if BMI permits.  Cardiac MRI 06/2020: IMPRESSION: 1.  Mild LVE with global  hypokinesis quantitative EF 38%   2.  Findings consistent with ventricular non compaction   3.  Mild RVE with mild hypokinesis quantitative EF 42%   4.  No delayed enhancement of LV myocardium   5.  Mild appearing MR   6.  Tri leaflet AV with normal aortic root 3.0 cm mild appearing AR   7.  Mild bi atrial enlargement  Recent Labs: 12/18/2022: BUN 13; Creatinine, Ser 1.08; Hemoglobin 15.3; Platelets 222; Potassium 4.7; Sodium 139  Recent Lipid Panel No results found for: "CHOL", "TRIG", "HDL", "CHOLHDL", "VLDL", "LDLCALC", "LDLDIRECT"  Risk Assessment/Calculations:   CHA2DS2-VASc Score = 2  This indicates a 2.2% annual risk of stroke. The patient's score is based upon: CHF History: 1 HTN History: 1 Diabetes History: 0 Stroke History: 0 Vascular Disease History: 0 Age Score: 0 Gender Score: 0    Review of Systems    All other systems reviewed and are otherwise negative except as noted above.  Physical Exam    VS:  BP 120/68   Pulse 76   Wt (!) 305 lb (138.3 kg)   SpO2 96%   BMI 39.16 kg/m  , BMI Body mass index is 39.16 kg/m.  Wt Readings from Last 3 Encounters:  03/20/23 (!) 305 lb (138.3 kg)  02/05/23 (!) 310 lb (140.6 kg)  12/18/22 (!) 309 lb (140.2 kg)     GEN: Obese, 58 year old male in no acute distress. HEENT: normal. Neck: Supple, no JVD, carotid bruits, or masses. Cardiac: S1/S2, irregularly irregular rhythm, no murmurs, rubs, or gallops. No clubbing, cyanosis, edema.  Radials/PT 2+ and equal bilaterally.  Respiratory:  Respirations regular and unlabored, clear to auscultation bilaterally. GI: Soft, nontender, nondistended. MS: No deformity or atrophy. Skin: Warm and dry, no rash. Neuro:  Strength and sensation are intact. Psych: Calm, cooperative, Flat  Assessment & Plan    Persistent to permanent atrial fibrillation Heart rate normal on exam. Denies any significant palpitations, says does occur at times, but not particularly bothersome.   See preliminary monitor report noted above.  Noted to have some tachycardia and was found to be in persistent A-fib, does state possibly skipping dose of Xarelto at times.  History of failed cardioversion in 2019. He is currently on appropriate dosage of Xarelto, denies any bleeding issues.  No eye redness noted on exam.  Heart healthy diet and regular cardiovascular exercise encouraged. Discussed compliance of Xarelto. Recommended Kardia mobile.  He is on the maximum dosage of metoprolol at this time.  Discussed A-fib clinic referral, and patient is agreeable to discuss antiarrhythmic options.  Will route note to cardiologist for further review.  2. HFmrEF Echo 02/2023 revealed EF estimated to be 45-50%.  Improved slightly from previous study.  Stage C, NYHA class I-II symptoms.  Weight is down from last office visit.  Continue Entresto, Farxiga, Toprol-XL, and spironolactone. Low sodium diet, fluid restriction <2L, and daily weights encouraged. Educated to contact our office for weight gain of 2 lbs overnight or 5 lbs in one week.  He is due for labs.    OSA Cannot tolerate CPAP machine.  Offered/recommended to refer to sleep services to get him set up for sleep mask, however he declines.  He is looking an inspire device.  Hypertension Blood pressure stable.  BP well-controlled at  home. Discussed to monitor BP at home at least 2 hours after medications and sitting for 5-10 minutes.  No medication changes at this time. Heart healthy diet and regular cardiovascular exercise encouraged.   Obesity Weight loss via diet and exercise encouraged. Discussed the impact being overweight would have on cardiovascular risk.  Disposition: Follow up in 4-6 weeks with Dina Rich, MD or APP.  Signed, Sharlene Dory, NP

## 2023-03-28 ENCOUNTER — Encounter (INDEPENDENT_AMBULATORY_CARE_PROVIDER_SITE_OTHER): Payer: Self-pay | Admitting: *Deleted

## 2023-03-31 ENCOUNTER — Telehealth: Payer: Self-pay | Admitting: Nurse Practitioner

## 2023-03-31 DIAGNOSIS — I480 Paroxysmal atrial fibrillation: Secondary | ICD-10-CM

## 2023-03-31 DIAGNOSIS — I4819 Other persistent atrial fibrillation: Secondary | ICD-10-CM

## 2023-03-31 DIAGNOSIS — I5022 Chronic systolic (congestive) heart failure: Secondary | ICD-10-CM

## 2023-03-31 NOTE — Telephone Encounter (Signed)
-----   Message from Sharlene Dory sent at 03/31/2023 11:05 AM EST ----- I ran this past Dr. Wyline Mood.  He agreed with referring patient to the A-fib clinic as his A-fib is not well-controlled.  Lets place referral to A-fib clinic and let patient know.  Thanks!   Best,  Sharlene Dory, NP ----- Message ----- From: Antoine Poche, MD Sent: 03/28/2023   7:34 PM EST To: Sharlene Dory, NP  Finalized monitor report, agree afib not well controlled. Agree with referral to afib clinic   Dominga Ferry MD ----- Message ----- From: Sharlene Dory, NP Sent: 03/24/2023   8:22 AM EST To: Antoine Poche, MD  Doing well but preliminary report shows poorly controlled A-fib with some elevated rates.  He is on maximal dosage of metoprolol.  Discussed A-fib clinic for antiarrhythmic options, and wanted to see what your thoughts are regarding this referral.  I appreciate your input.  Thanks!   Best,  Sharlene Dory, NP

## 2023-03-31 NOTE — Telephone Encounter (Signed)
Patient informed and verbalized understanding of plan. Referral placed.

## 2023-04-01 ENCOUNTER — Telehealth (HOSPITAL_COMMUNITY): Payer: Self-pay

## 2023-04-01 NOTE — Telephone Encounter (Signed)
Called patient at [phone] to schedule a f/u appt from the [facility] AFIB referral workqueue.   Patient did not answer the telephone call and front office left patient a voicemail to schedule a f/u appt with the [facility].

## 2023-04-10 ENCOUNTER — Other Ambulatory Visit: Payer: Self-pay | Admitting: Cardiology

## 2023-04-16 ENCOUNTER — Ambulatory Visit (HOSPITAL_COMMUNITY)
Admission: RE | Admit: 2023-04-16 | Discharge: 2023-04-16 | Disposition: A | Discharge status: Home / Self Care | Payer: 59 | Source: Ambulatory Visit | Attending: Internal Medicine | Admitting: Internal Medicine

## 2023-04-16 VITALS — BP 88/76 | HR 83 | Ht 74.0 in | Wt 308.0 lb

## 2023-04-16 DIAGNOSIS — Z7901 Long term (current) use of anticoagulants: Secondary | ICD-10-CM | POA: Diagnosis not present

## 2023-04-16 DIAGNOSIS — E669 Obesity, unspecified: Secondary | ICD-10-CM | POA: Insufficient documentation

## 2023-04-16 DIAGNOSIS — I5022 Chronic systolic (congestive) heart failure: Secondary | ICD-10-CM | POA: Diagnosis not present

## 2023-04-16 DIAGNOSIS — D6869 Other thrombophilia: Secondary | ICD-10-CM | POA: Insufficient documentation

## 2023-04-16 DIAGNOSIS — I4811 Longstanding persistent atrial fibrillation: Secondary | ICD-10-CM | POA: Insufficient documentation

## 2023-04-16 DIAGNOSIS — Z79899 Other long term (current) drug therapy: Secondary | ICD-10-CM | POA: Insufficient documentation

## 2023-04-16 DIAGNOSIS — F419 Anxiety disorder, unspecified: Secondary | ICD-10-CM | POA: Insufficient documentation

## 2023-04-16 DIAGNOSIS — I110 Hypertensive heart disease with heart failure: Secondary | ICD-10-CM | POA: Diagnosis not present

## 2023-04-16 DIAGNOSIS — F32A Depression, unspecified: Secondary | ICD-10-CM | POA: Diagnosis not present

## 2023-04-16 DIAGNOSIS — Z6839 Body mass index (BMI) 39.0-39.9, adult: Secondary | ICD-10-CM | POA: Diagnosis not present

## 2023-04-16 NOTE — Progress Notes (Signed)
Primary Care Physician: Assunta Found, MD Primary Cardiologist: Dina Rich, MD Electrophysiologist: None     Referring Physician: Sharlene Dory, NP     [name] is a 58 y.o. male with a history of HFrEF, OSA, HTN, obesity, depression/anxiety, atrial fibrillation who presents for consultation in the [facility]. Seen by Sharlene Dory, NP, in December 2024 and January 2025. Echo showed mild improvement in reduced EF. Cardiac monitor showed 100% Afib burden with HR 50-218; average 113. Patient is on Xarelto for a CHADS2VASC score of 2.  On evaluation today, he is currently in Afib. Review of records show last ECG to show sinus rhythm was in 2018. History of failed DCCV in 2021. He is on Xarelto 20 mg daily for anticoagulation. He feels tired frequently.   Today, he denies symptoms of palpitations, chest pain, shortness of breath, orthopnea, PND, lower extremity edema, dizziness, presyncope, syncope, snoring, daytime somnolence, bleeding, or neurologic sequela. The patient is tolerating medications without difficulties and is otherwise without complaint today.    Atrial Fibrillation Risk Factors:  he does have symptoms or diagnosis of sleep apnea. he is compliant with CPAP therapy as of recently due to newly received mask.  he has a BMI of Body mass index is 39.54 kg/m.Marland Kitchen Filed Weights   04/16/23 1018  Weight: (!) 139.7 kg    Current Outpatient Medications  Medication Sig Dispense Refill   acetaminophen (TYLENOL) 500 MG tablet Take 1,000 mg by mouth every 6 (six) hours as needed for moderate pain.      albuterol (PROAIR HFA) 108 (90 Base) MCG/ACT inhaler Inhale 1 puff into the lungs every 6 (six) hours as needed for wheezing or shortness of breath. 3.7 g 1   buPROPion (WELLBUTRIN XL) 150 MG 24 hr tablet Take 150 mg by mouth daily.     escitalopram (LEXAPRO) 10 MG tablet Take 10 mg by mouth daily.     FARXIGA 10 MG TABS tablet TAKE 1 TABLET  BY MOUTH DAILY BEFORE BREAKFAST 90 tablet 3   Magnesium 250 MG TABS Take 250 mg by mouth daily.      metoprolol succinate (TOPROL-XL) 100 MG 24 hr tablet TAKE 1 TABLET BY MOUTH EVERY MORNING and TAKE 1 TABLET AT BEDTIME WITH OR IMMEDIATELY FOLLOWING A MEAL 60 tablet 6   Omega-3 Fatty Acids (FISH OIL) 1000 MG CAPS Take 2 capsules by mouth every morning.     sacubitril-valsartan (ENTRESTO) 49-51 MG Take 1 tablet by mouth 2 (two) times daily. 60 tablet 5   spironolactone (ALDACTONE) 25 MG tablet Take 1 tablet (25 mg total) by mouth daily. 90 tablet 2   Vitamin D, Cholecalciferol, 25 MCG (1000 UT) TABS Take 1,000 Units by mouth daily.      XARELTO 20 MG TABS tablet TAKE 1 TABLET BY MOUTH EVERY DAY WITH SUPPER 30 tablet 5   No current facility-administered medications for this encounter.    Atrial Fibrillation Management history:  Previous antiarrhythmic drugs: none Previous cardioversions: 2021 (Failed) Previous ablations: none Anticoagulation history: Xarelto   ROS- All systems are reviewed and negative except as per the HPI above.  Physical Exam: BP (!) 88/76   Pulse 83   Ht 6\' 2"  (1.88 m)   Wt (!) 139.7 kg   BMI 39.54 kg/m   GEN: Well nourished, well developed in no acute distress NECK: No JVD; No carotid bruits CARDIAC: Irregularly irregular rate and rhythm, no murmurs, rubs, gallops RESPIRATORY:  Clear to auscultation without rales,  wheezing or rhonchi  ABDOMEN: Soft, non-tender, non-distended EXTREMITIES:  No edema; No deformity   EKG today demonstrates  Vent. rate 83 BPM PR interval * ms QRS duration 98 ms QT/QTcB 364/427 ms P-R-T axes * 78 151 Atrial fibrillation Low voltage QRS Cannot rule out Anterior infarct , age undetermined T wave abnormality, consider lateral ischemia Abnormal ECG When compared with ECG of 05-Feb-2023 13:39, PREVIOUS ECG IS PRESENT  Echo 02/18/23 demonstrated   1. Left ventricular ejection fraction, by estimation, is 45 to 50%. The   left ventricle has mildly decreased function. Left ventricular endocardial  border not optimally defined to evaluate regional wall motion. The left  ventricular internal cavity size  was moderately dilated. Left ventricular diastolic function could not be  evaluated.   2. Right ventricular systolic function is mild to moderately reduced. The  right ventricular size is moderately enlarged. Tricuspid regurgitation  signal is inadequate for assessing PA pressure.   3. Right atrial size was mildly dilated.   4. The mitral valve is normal in structure. No evidence of mitral valve  regurgitation. No evidence of mitral stenosis.   5. The aortic valve is tricuspid. Aortic valve regurgitation is not  visualized. No aortic stenosis is present.    ASSESSMENT & PLAN CHA2DS2-VASc Score = 2  The patient's score is based upon: CHF History: 1 HTN History: 1 Diabetes History: 0 Stroke History: 0 Vascular Disease History: 0 Age Score: 0 Gender Score: 0       ASSESSMENT AND PLAN: Longstanding Persistent Atrial Fibrillation (ICD10:  I48.11) The patient's CHA2DS2-VASc score is 2, indicating a 2.2% annual risk of stroke.    He is in Afib. We discussed option of treatment, with emphasis of low probability of success, which at this time includes rhythm control in the form of Tikosyn admission. At this time, I would not recommend a repeat cardioversion of 360J because probability of maintaining normal rhythm after procedure would likely be low. He would have to transition from Lexapro. I emphasized that since he appears to be in Afib since 2018 that probability of success is low. He will consider this option and think it over if he wants to go through hospital admission knowing there is not a high chance of success.   Secondary Hypercoagulable State (ICD10:  D68.69) The patient is at significant risk for stroke/thromboembolism based upon his CHA2DS2-VASc Score of 2.  Continue Rivaroxaban (Xarelto).   Continue without interruption.    Follow up Afib clinic prn.    Lake Bells, PA-C  Afib Clinic [facility] [address] [place], [place] [address] [phone]

## 2023-04-24 ENCOUNTER — Ambulatory Visit: Payer: 59 | Attending: Nurse Practitioner | Admitting: Nurse Practitioner

## 2023-04-24 ENCOUNTER — Encounter: Payer: Self-pay | Admitting: Nurse Practitioner

## 2023-04-24 NOTE — Progress Notes (Deleted)
 Office Visit    Patient Name: [name] Date of Encounter: 03/20/2023 PCP:  Assunta Found, MD [facility]  Cardiologist:  Dina Rich, MD  Advanced Practice Provider:  No care team member to display Electrophysiologist:  None   Chief Complaint and HPI    [name] is a 58 y.o. male with a hx of A-fib, s/p DCCV in 2019, HFmrEF, OSA, hypertension, obesity, depression/anxiety, who presents today for scheduled follow-up.    Previous cardiovascular history includes new diagnosis of A-fib/a flutter in 2020, seen on 2-week event monitor with several RVR episodes.  Underwent DCCV attempt in 2019, failed to convert to sinus rhythm.  In 2021, echocardiogram revealed EF 30 to 35%.  Cardiac MRI results from 2022 noted below.  EF at that time estimated to be 38%.  Last seen by Dr. Dina Rich on October 02, 2021.  Was recommended may need to consider cardiac catheterization depending on clinical course.  Echocardiogram was repeated and revealed EF mildly reduced at 40 to 45%.  Was referred again to pulmonology for history of OSA.  02/05/2023 - Wife recently contacted office noting concerns for A-fib and depression. Concerned about weakness, has been in and out of A-fib for a while, nothing has changed. Today he presents to our office.  His chief concern is feeling fatigued and weak, notices occasional palpitations.  Does endorse weight gain due to not being as active.  Unable to tolerate CPAP mask for sleep apnea, looking into the inspire device.  Has had a few occasions of skipping Xarelto dose.  Denies any chest pain, shortness of breath, syncope, presyncope, dizziness, orthopnea, PND, swelling, acute bleeding, or claudication.  Denies any SI/HI. See monitor report below.   03/20/2023 - Today he presents for follow-up. Overall doing well and denies any overt sense of tachycardia or significant palpitations.  Denies any chest pain, shortness of breath, syncope,  presyncope, dizziness, orthopnea, PND, swelling or significant weight changes, acute bleeding, or claudication.  SH: Works for [employer] in [employer] Studies Reviewed:   The following studies were reviewed today:   EKG:  EKG is not ordered today.  Cardiac monitor 02/2023 (preliminary report):  Patch Wear Time:  8 days and 20 hours (2024-12-06T14:53:41-0500 to 2024-12-15T11:25:41-0500)   Atrial Fibrillation occurred continuously (100% burden), ranging from 50-218 bpm (avg of 113 bpm). Some episodes of Atrial Fibrillation conducted with possible aberrancy. Isolated VEs were rare (<1.0%), VE Couplets were rare (<1.0%), and no VE Triplets  were present.  Echo 02/2023:  1. Left ventricular ejection fraction, by estimation, is 45 to 50%. The  left ventricle has mildly decreased function. Left ventricular endocardial  border not optimally defined to evaluate regional wall motion. The left  ventricular internal cavity size  was moderately dilated. Left ventricular diastolic function could not be  evaluated.   2. Right ventricular systolic function is mild to moderately reduced. The  right ventricular size is moderately enlarged. Tricuspid regurgitation  signal is inadequate for assessing PA pressure.   3. Right atrial size was mildly dilated.   4. The mitral valve is normal in structure. No evidence of mitral valve  regurgitation. No evidence of mitral stenosis.   5. The aortic valve is tricuspid. Aortic valve regurgitation is not  visualized. No aortic stenosis is present.  Limited echo 10/2021: IMPRESSIONS    1. Windows remain challenging. swirling of contrast is seen near the  apex. Left ventricular ejection fraction, by estimation, is 40 to 45%.  The  left ventricle has mildly decreased function.   Conclusion(s)/Recommendation(s): Recommend considering cardiac MRI to r/o  apical thrombus if BMI permits.  Cardiac MRI 06/2020: IMPRESSION: 1.  Mild LVE with global  hypokinesis quantitative EF 38%   2.  Findings consistent with ventricular non compaction   3.  Mild RVE with mild hypokinesis quantitative EF 42%   4.  No delayed enhancement of LV myocardium   5.  Mild appearing MR   6.  Tri leaflet AV with normal aortic root 3.0 cm mild appearing AR   7.  Mild bi atrial enlargement  Recent Labs: 12/18/2022: BUN 13; Creatinine, Ser 1.08; Hemoglobin 15.3; Platelets 222; Potassium 4.7; Sodium 139  Recent Lipid Panel No results found for: "CHOL", "TRIG", "HDL", "CHOLHDL", "VLDL", "LDLCALC", "LDLDIRECT"  Risk Assessment/Calculations:   CHA2DS2-VASc Score = 2  This indicates a 2.2% annual risk of stroke. The patient's score is based upon: CHF History: 1 HTN History: 1 Diabetes History: 0 Stroke History: 0 Vascular Disease History: 0 Age Score: 0 Gender Score: 0    Review of Systems    All other systems reviewed and are otherwise negative except as noted above.  Physical Exam    VS:  There were no vitals taken for this visit. , BMI There is no height or weight on file to calculate BMI.  Wt Readings from Last 3 Encounters:  04/16/23 (!) 308 lb (139.7 kg)  03/20/23 (!) 305 lb (138.3 kg)  02/05/23 (!) 310 lb (140.6 kg)     GEN: Obese, 58 year old male in no acute distress. HEENT: normal. Neck: Supple, no JVD, carotid bruits, or masses. Cardiac: S1/S2, irregularly irregular rhythm, no murmurs, rubs, or gallops. No clubbing, cyanosis, edema.  Radials/PT 2+ and equal bilaterally.  Respiratory:  Respirations regular and unlabored, clear to auscultation bilaterally. GI: Soft, nontender, nondistended. MS: No deformity or atrophy. Skin: Warm and dry, no rash. Neuro:  Strength and sensation are intact. Psych: Calm, cooperative, Flat  Assessment & Plan    Persistent to permanent atrial fibrillation Heart rate normal on exam. Denies any significant palpitations, says does occur at times, but not particularly bothersome.  See preliminary  monitor report noted above.  Noted to have some tachycardia and was found to be in persistent A-fib, does state possibly skipping dose of Xarelto at times.  History of failed cardioversion in 2019. He is currently on appropriate dosage of Xarelto, denies any bleeding issues.  No eye redness noted on exam.  Heart healthy diet and regular cardiovascular exercise encouraged. Discussed compliance of Xarelto. Recommended Kardia mobile.  He is on the maximum dosage of metoprolol at this time.  Discussed A-fib clinic referral, and patient is agreeable to discuss antiarrhythmic options.  Will route note to cardiologist for further review.  2. HFmrEF Echo 02/2023 revealed EF estimated to be 45-50%.  Improved slightly from previous study.  Stage C, NYHA class I-II symptoms.  Weight is down from last office visit.  Continue Entresto, Farxiga, Toprol-XL, and spironolactone. Low sodium diet, fluid restriction <2L, and daily weights encouraged. Educated to contact our office for weight gain of 2 lbs overnight or 5 lbs in one week.  He is due for labs.    OSA Cannot tolerate CPAP machine.  Offered/recommended to refer to sleep services to get him set up for sleep mask, however he declines.  He is looking an inspire device.  Hypertension Blood pressure stable.  BP well-controlled at home. Discussed to monitor BP at home at least 2 hours  after medications and sitting for 5-10 minutes.  No medication changes at this time. Heart healthy diet and regular cardiovascular exercise encouraged.   Obesity Weight loss via diet and exercise encouraged. Discussed the impact being overweight would have on cardiovascular risk.  Disposition: Follow up in 4-6 weeks with Dina Rich, MD or APP.  Signed, Sharlene Dory, NP

## 2023-05-20 ENCOUNTER — Telehealth: Payer: Self-pay | Admitting: Internal Medicine

## 2023-05-20 NOTE — Telephone Encounter (Signed)
 Left patient a message that we received the colonoscopy questionnaire back and he needs to have an OV before his procedure his scheduled.

## 2023-06-27 ENCOUNTER — Telehealth: Payer: Self-pay | Admitting: Internal Medicine

## 2023-06-27 NOTE — Telephone Encounter (Signed)
 Received colonoscopy questionnaire and needs OV first... left a message asking him to call the office to schedule appointment for the OV

## 2023-06-30 ENCOUNTER — Telehealth: Payer: Self-pay | Admitting: Gastroenterology

## 2023-06-30 NOTE — Telephone Encounter (Signed)
 Pt called left voicemail to make appt called pt left voicemail

## 2023-07-01 ENCOUNTER — Telehealth: Payer: Self-pay | Admitting: Gastroenterology

## 2023-07-01 NOTE — Telephone Encounter (Signed)
 Left voicemail with pt

## 2023-07-09 ENCOUNTER — Other Ambulatory Visit: Payer: Self-pay | Admitting: Nurse Practitioner

## 2023-07-11 ENCOUNTER — Encounter: Payer: Self-pay | Admitting: Gastroenterology

## 2023-07-11 NOTE — Progress Notes (Unsigned)
 Referring Provider:   Minus Amel, MD Primary Care Physician:  Minus Amel, MD Primary Gastroenterologist:  Dr. Mordechai April  Chief Complaint  Patient presents with   Colonoscopy    Colonoscopy screening and thinks he has hemorrhoids     HPI:   [name] is a 58 y.o. male presenting today at the request of   Minus Amel, MD for colon cancer screening.  Last complete colonoscopy was 12/10/2011 with moderate-sized internal hemorrhoids, otherwise normal exam.  Flexible sigmoidoscopy 04/12/2013 normal sigmoid colon, moderate size internal hemorrhoids s/p banding x 3.   Today: Having some issues with hemorrhoids.  Reports hemorrhoids have been back for several years now. Has intermittent rectal bleeding. Rectal bleeding can be once a week if straining or passing harder stools. Has some issues with constipation. Feels like it is secondary to diet. Drinks a lot of soda and doesn't drink a lot of water  or eat many vegetables.   Bowels move every 3-4 days. Taking 2 colace in the morning and 1 in the evening.  Uses over-the-counter hemorrhoid cream..  No abdominal pain.   No Fhx of colon cancer.   Sometimes has nausea. No vomiting. Eating fine. Has a good appetite. No heartburn or dysphagia.   Past Medical History:  Diagnosis Date   Anal fissure    Anxiety    Asthma    "mild" per patient. inhaler prn   Atrial fibrillation (HCC)    Atrial fibrillation (HCC)    Cancer (HCC)    right shoulder melanoma   COPD (chronic obstructive pulmonary disease) (HCC)    "mild" per patient, no smoking history, possibly due to working in a [employer]   Depression    GERD (gastroesophageal reflux disease)    H/O hiatal hernia    Heart failure with mildly reduced ejection fraction (HCC)    History of kidney stones    Hypertension    Obesity (BMI 30-39.9) AUG 2013 266 LBS   FEB 2015 284 LBS    Past Surgical History:  Procedure Laterality Date   anterior cervical disectomy      APPENDECTOMY     CARDIOVERSION N/A 07/21/2019   Procedure: CARDIOVERSION;  Surgeon: Laurann Pollock, MD;  [facility]: [facility];  Service: Endoscopy;  Laterality: N/A;   CIRCUMCISION     COLONOSCOPY  11/21/2011   MOD IH     DG ARTHRO THUMB*L*     removal of cyst   FLEXIBLE SIGMOIDOSCOPY N/A 04/12/2013   HEMRRHOID BANDING X3.    HEMORRHOID BANDING N/A 04/12/2013   Procedure: Harles Lied;  Surgeon: Alyce Jubilee, MD;  Location: [facility];  Service: Endoscopy;  Laterality: N/A;   NERVE, TENDON AND ARTERY REPAIR Left 02/21/2017   Procedure: WOUND EXPLORATION LEFT INDEX, LONG, AND RING FINGERS;  NERVE, TENDON AND ARTERY REPAIR;  Surgeon: Brunilda Capra, MD;  Location: [facility];  Service: Orthopedics;  Laterality: Left;    Current Outpatient Medications  Medication Sig Dispense Refill   acetaminophen  (TYLENOL ) 500 MG tablet Take 1,000 mg by mouth every 6 (six) hours as needed for moderate pain.      buPROPion (WELLBUTRIN XL) 150 MG 24 hr tablet Take 150 mg by mouth daily.     docusate sodium (COLACE) 50 MG capsule Take 50 mg by mouth 2 (two) times daily.     escitalopram (LEXAPRO) 10 MG tablet Take 10 mg by mouth daily.     FARXIGA  10 MG TABS tablet TAKE 1 TABLET BY MOUTH  DAILY BEFORE BREAKFAST 90 tablet 3   hydrocortisone  (ANUSOL -HC) 2.5 % rectal cream Place 1 Application rectally 2 (two) times daily. 30 g 1   Magnesium 250 MG TABS Take 300 mg by mouth daily.     metoprolol  succinate (TOPROL -XL) 100 MG 24 hr tablet TAKE 1 TABLET BY MOUTH EVERY MORNING and TAKE 1 TABLET AT BEDTIME WITH OR IMMEDIATELY FOLLOWING A MEAL 60 tablet 6   Omega-3 Fatty Acids (FISH OIL) 1000 MG CAPS Take 2 capsules by mouth every morning.     sacubitril -valsartan  (ENTRESTO ) 97-103 MG Take 1 tablet by mouth 2 (two) times daily.     spironolactone  (ALDACTONE ) 25 MG tablet Take 1 tablet (25 mg total) by mouth daily. 90 tablet 2   Vitamin D, Cholecalciferol, 25 MCG (1000 UT) TABS Take 1,000 Units  by mouth daily.      XARELTO  20 MG TABS tablet TAKE 1 TABLET BY MOUTH EVERY DAY WITH SUPPER 30 tablet 5   No current facility-administered medications for this visit.    Allergies as of 07/14/2023   (No Known Allergies)    Family History  Problem Relation Age of Onset   Liver disease Brother        ? etiology   Colon cancer Neg Hx     Social History   Socioeconomic History   Marital status: Married    Spouse name: Not on file   Number of children: 2   Years of education: Not on file   Highest education level: Not on file  Occupational History   Occupation: Child psychotherapist  Tobacco Use   Smoking status: Never   Smokeless tobacco: Never  Vaping Use   Vaping status: Never Used  Substance and Sexual Activity   Alcohol use: Not Currently    Alcohol/week: 0.0 standard drinks of alcohol    Comment: socially   Drug use: No   Sexual activity: Yes    Birth control/protection: None  Other Topics Concern   Not on file  Social History Narrative   Not on file   Social Drivers of Health   Financial Resource Strain: Not on file  Food Insecurity: Not on file  Transportation Needs: Not on file  Physical Activity: Not on file  Stress: Not on file  Social Connections: Not on file  Intimate Partner Violence: Not on file    Review of Systems: Gen: Denies any fever, chills, cold or flulike symptoms, presyncope, syncope. CV: Denies chest pain, heart palpitations. Resp: Denies shortness of breath, cough. GI: See HPI GU : Denies urinary burning, urinary frequency, urinary hesitancy MS: Denies joint pain. Derm: Denies rash. Psych: Denies depression, anxiety. Heme: See HPI  Physical Exam: BP 105/65 (BP Location: Right Arm, Patient Position: Sitting, Cuff Size: Large)   Pulse 88   Temp 97.7 F (36.5 C) (Temporal)   Ht 6\' 2"  (1.88 m)   Wt (!) 304 lb 12.8 oz (138.3 kg)   BMI 39.13 kg/m  General: Alert and oriented. Pleasant and cooperative. Well-nourished and well-developed.   Head:  Normocephalic and atraumatic. Eyes:  Without icterus, sclera clear and conjunctiva pink.  Ears:  Normal auditory acuity. Lungs:  Clear to auscultation bilaterally. No wheezes, rales, or rhonchi. No distress.  Heart:  S1, S2 present without murmurs appreciated.  Abdomen:  +BS, soft, non-tender and non-distended. No HSM noted. No guarding or rebound. No masses appreciated.  Rectal:  Deferred to time of colonoscopy. Msk:  Symmetrical without gross deformities. Normal posture. Extremities:  Without edema. Neurologic:  Alert  and  oriented x4;  grossly normal neurologically. Skin:  Intact without significant lesions or rashes. Psych:  Normal mood and affect.    Assessment:  58 year old male with history of atrial fibrillation on Xarelto , heart failure with mildly reduced ejection fraction, HTN, COPD, obesity, hemorrhoids s/p hemorrhoid banding in 2015, presenting today to discuss scheduling colonoscopy.  Also reporting rectal bleeding, hemorrhoids, constipation.  Rectal bleeding: Chronic, intermittent rectal bleeding likely secondary to benign anorectal source such as hemorrhoids in the setting of uncontrolled constipation.  However, unable to rule out colon polyps or malignancy as he has not had a complete colonoscopy since 2013.   Constipation: Chronic.  Uncontrolled on Colace.     Plan:  Proceed with colonoscopy with propofol  by Dr. Mordechai April in near future. The risks, benefits, and alternatives have been discussed with the patient in detail. The patient states understanding and desires to proceed.  ASA 3 Will get okay to hold Xarelto  x 2 days prior to procedure from cardiology. Hold Farxiga  x 3 days prior. Will give Linzess  145 mcg daily x 4 days prior to starting colon prep due to chronic constipation. Start MiraLAX 17 g 1-2 times daily. Encouraged to increase his water  intake and dietary fiber intake with fruits, vegetables, whole grains. Follow-up after procedure.   Shana Daring, PA-C [facility] 07/14/2023

## 2023-07-14 ENCOUNTER — Telehealth: Payer: Self-pay | Admitting: *Deleted

## 2023-07-14 ENCOUNTER — Ambulatory Visit: Admitting: Gastroenterology

## 2023-07-14 ENCOUNTER — Encounter: Payer: Self-pay | Admitting: Gastroenterology

## 2023-07-14 VITALS — BP 105/65 | HR 88 | Temp 97.7°F | Ht 74.0 in | Wt 304.8 lb

## 2023-07-14 DIAGNOSIS — K649 Unspecified hemorrhoids: Secondary | ICD-10-CM

## 2023-07-14 DIAGNOSIS — K5909 Other constipation: Secondary | ICD-10-CM

## 2023-07-14 DIAGNOSIS — K625 Hemorrhage of anus and rectum: Secondary | ICD-10-CM

## 2023-07-14 DIAGNOSIS — K5900 Constipation, unspecified: Secondary | ICD-10-CM

## 2023-07-14 MED ORDER — HYDROCORTISONE (PERIANAL) 2.5 % EX CREA
1.0000 | TOPICAL_CREAM | Freq: Two times a day (BID) | CUTANEOUS | 1 refills | Status: AC
Start: 2023-07-14 — End: ?

## 2023-07-14 NOTE — Telephone Encounter (Signed)
Please advise holding Xarelto prior to colonoscopy.  Thank you!  DW

## 2023-07-14 NOTE — Patient Instructions (Signed)
 We will get you scheduled for a colonoscopy with Dr. Mordechai April.   Start MiraLAX 17 g (1 capful) daily in 8 oz of water  or other noncarbonated beverage of your choice.  You can increase this to twice a day if needed.  Use Anusol  rectal cream twice daily for 5 to 7 days as needed for hemorrhoids.  I will see you back in the office after your colonoscopy.  Shana Daring, PA-C [facility]

## 2023-07-14 NOTE — Telephone Encounter (Signed)
  Request for patient to stop medication prior to procedure or is needing cleareance  07/14/23  [name] 03-02-1966  What type of surgery is being performed? Colonoscopy  When is surgery scheduled? TBD  What type of clearance is required (medical or pharmacy to hold medication or both? medication  Are there any medications that need to be held prior to surgery and how long? Xarelto  x 2 days  Name of physician performing surgery?  Dr.Carver Lee [facility] at [employer]: [phone] Fax: [phone]  Anethesia type (none, local, MAC, general)? MAC

## 2023-07-15 NOTE — Telephone Encounter (Signed)
   Name: [name]  DOB: 03/06/1965  MRN: [id number]   Primary Cardiologist: Armida Lander, MD  Chart reviewed as part of pre-operative protocol coverage.  Per Pharm D, patient may hold Xarelto  for 2 days prior to procedure.    I will route this recommendation to the requesting party via Epic fax function and remove from pre-op pool. Please call with questions.  Morey Ar, NP 07/15/2023, 3:18 PM

## 2023-07-15 NOTE — Telephone Encounter (Signed)
 Patient with diagnosis of A Fib on Xarelto  for anticoagulation.    Procedure: colonoscopy Date of procedure: TBD   CHA2DS2-VASc Score = 2  This indicates a 2.2% annual risk of stroke. The patient's score is based upon: CHF History: 1 HTN History: 1 Diabetes History: 0 Stroke History: 0 Vascular Disease History: 0 Age Score: 0 Gender Score: 0   CrCl 112 ml/min using adj body weight Platelet count 222K  Per office protocol, patient can hold Xarelto  for 2 days prior to procedure.    **This guidance is not considered finalized until pre-operative APP has relayed final recommendations.**

## 2023-07-16 NOTE — Telephone Encounter (Signed)
 Okay was given to hold Xarelto  for 2 days prior to procedure.  Please proceed with scheduling.

## 2023-07-17 NOTE — Telephone Encounter (Signed)
 LMOVM to return call.

## 2023-07-29 ENCOUNTER — Encounter: Payer: Self-pay | Admitting: *Deleted

## 2023-07-29 NOTE — Telephone Encounter (Signed)
 LMOVM to return call. Mailed letter

## 2023-08-05 NOTE — Telephone Encounter (Signed)
 LMOVM to return call  Pt left vm returning call

## 2023-08-10 ENCOUNTER — Other Ambulatory Visit: Payer: Self-pay | Admitting: Cardiology

## 2023-08-12 NOTE — Telephone Encounter (Signed)
 LMOVM to return call.

## 2023-08-27 NOTE — Telephone Encounter (Signed)
 Would need to schedule in room 3.   Dr. Cindie, FYI

## 2023-08-27 NOTE — Telephone Encounter (Signed)
 Called to schedule pt and he states his last colonoscopy had to be done in the OR due to vagus nerve being hit and his heart rate dropped down to 31. He says it was done when Dr.Fields was here. Please advise. Thank you

## 2023-08-28 ENCOUNTER — Encounter: Payer: Self-pay | Admitting: *Deleted

## 2023-08-28 ENCOUNTER — Other Ambulatory Visit: Payer: Self-pay | Admitting: *Deleted

## 2023-08-28 MED ORDER — PEG 3350-KCL-NA BICARB-NACL 420 G PO SOLR: 4000.0000 mL | Freq: Once | ORAL | 0 refills | Status: AC

## 2023-08-28 NOTE — Telephone Encounter (Signed)
 Pt informed. He has been scheduled for 09/26/23. Instructions mailed and prep sent to the pharmacy.

## 2023-08-28 NOTE — Telephone Encounter (Signed)
 [employer] PA:  CPT Code GECOL Description: Colonoscopy Case Number: [id number] Review Date: 08/28/2023 8:56:26 AM Expiration Date: N/A Status: This member is not in scope for prior-authorization/notification for the services requested. You can save the case reference ID as validation of your request.

## 2023-09-22 ENCOUNTER — Telehealth: Payer: Self-pay | Admitting: *Deleted

## 2023-09-22 NOTE — Telephone Encounter (Signed)
 Spouse called in. Did not have linzess  for patient to do prior to procedure. I advised will leave sample for pick up at [address]. She voiced understanding.

## 2023-09-24 NOTE — Patient Instructions (Signed)
 Your procedure is scheduled on Friday, September 26, 2023.  Report to [facility] Main Entrance at 9:00 A.M.   Call this number if you have problems the morning of surgery:  [phone]  If you experience any cold or flu symptoms such as cough, fever, chills, shortness of breath, etc. between now and your scheduled surgery, please notify us  at the above number.   Remember:  FOLLOW DIET AND PREP INSTRUCTIONS GIVEN TO YOU BY DR.CARVER'S OFFICE.   You may drink clear liquids until 7:00 am .  Clear liquids allowed are:  Water , Juice (No red color; non-citric and without pulp; diabetics please choose diet or no sugar options), Carbonated beverages (diabetics please choose diet or no sugar options), Clear Tea (No creamer, milk, or cream, including half & half and powdered creamer), Black Coffee Only (No creamer, milk or cream, including half & half and powdered creamer), and Clear Sports drink (No red color; diabetics please choose diet or no sugar options)    Take these medicines the morning of surgery with A SIP OF WATER     buPROPion, escitalopram, metoprolol , ENTRESTO    FARXIGA  HOLD 3 DAYS PRIOR TO PROCEDURE-LAST DOSE ON 09/22/23  XARELTO  HOLD 2 DAYS PRIOR TO PROCEDURE-LAST DOSE ON 09/23/23    Do not wear jewelry, make-up or nail polish, including gel polish,  artificial nails, or any other type of covering on natural nails (fingers and  toes).  Do not wear lotions, powders, or perfumes, or deodorant.  Do not shave 48 hours prior to surgery.  Men may shave face and neck.  Do not bring valuables to the hospital.  [facility] is not responsible for any belongings or valuables.  Contacts, dentures or bridgework may not be worn into surgery.  Leave your suitcase in the car.  After surgery it may be brought to your room.  For patients admitted to the hospital, discharge time will be determined by your treatment team.  Patients discharged the day of surgery will not be allowed to  drive home and must have someone be with them for 24 hours.   Special instructions: DO NOT SMOKE TOBACCO OR VAPE 24 HOURS PRIOR TO YOUR PROCEDURE.   Please brush your teeth morning of procedure.   Please read over the following fact sheets that you were given. Anesthesia Post-op Instructions and Care and Recovery After Surgery  Colonoscopy, Adult A colonoscopy is a procedure to look at the entire large intestine. This procedure is done using a long, thin, flexible tube that has a camera on the end. You may have a colonoscopy: As a part of normal colorectal screening. If you have certain symptoms, such as: A low number of red blood cells in your blood (anemia). Diarrhea that does not go away. Pain in your abdomen. Blood in your stool. A colonoscopy can help screen for and diagnose medical problems, including: An abnormal growth of cells or tissue (tumor). Abnormal growths within the lining of your intestine (polyps). Inflammation. Areas of bleeding. Tell your health care provider about: Any allergies you have. All medicines you are taking, including vitamins, herbs, eye drops, creams, and over-the-counter medicines. Any problems you or family members have had with anesthetic medicines. Any bleeding problems you have. Any surgeries you have had. Any medical conditions you have. Any problems you have had with having bowel movements. Whether you are pregnant or may be pregnant. What are the risks? Generally, this is a safe procedure. However, problems may occur, including: Bleeding. Damage to your  intestine. Allergic reactions to medicines given during the procedure. Infection. This is rare. What happens before the procedure? Eating and drinking restrictions Follow instructions from your health care provider about eating or drinking restrictions, which may include: A few days before the procedure: Follow a low-fiber diet. Avoid nuts, seeds, dried fruit, raw fruits, and  vegetables. 1-3 days before the procedure: Eat only gelatin dessert or ice pops. Drink only clear liquids, such as water , clear juice, clear broth or bouillon, black coffee or tea, or clear soft drinks or sports drinks. Avoid liquids that contain red or purple dye. The day of the procedure: Do not eat solid foods. You may continue to drink clear liquids until up to 2 hours before the procedure. Do not eat or drink anything starting 2 hours before the procedure, or within the time period that your health care provider recommends. Bowel prep If you were prescribed a bowel prep to take by mouth (orally) to clean out your colon: Take it as told by your health care provider. Starting the day before your procedure, you will need to drink a large amount of liquid medicine. The liquid will cause you to have many bowel movements of loose stool until your stool becomes almost clear or light green. If your skin or the opening between the buttocks (anus) gets irritated from diarrhea, you may relieve the irritation using: Wipes with medicine in them, such as adult wet wipes with aloe and vitamin E. A product to soothe skin, such as petroleum jelly. If you vomit while drinking the bowel prep: Take a break for up to 60 minutes. Begin the bowel prep again. Call your health care provider if you keep vomiting or you cannot take the bowel prep without vomiting. To clean out your colon, you may also be given: Laxative medicines. These help you have a bowel movement. Instructions for enema use. An enema is liquid medicine injected into your rectum. Medicines Ask your health care provider about: Changing or stopping your regular medicines or supplements. This is especially important if you are taking iron supplements, diabetes medicines, or blood thinners. Taking medicines such as aspirin and ibuprofen. These medicines can thin your blood. Do not take these medicines unless your health care provider tells you to  take them. Taking over-the-counter medicines, vitamins, herbs, and supplements. General instructions Ask your health care provider what steps will be taken to help prevent infection. These may include washing skin with a germ-killing soap. If you will be going home right after the procedure, plan to have a responsible adult: Take you home from the hospital or clinic. You will not be allowed to drive. Care for you for the time you are told. What happens during the procedure?  An IV will be inserted into one of your veins. You will be given a medicine to make you fall asleep (general anesthetic). You will lie on your side with your knees bent. A lubricant will be put on the tube. Then the tube will be: Inserted into your anus. Gently eased through all parts of your large intestine. Air will be sent into your colon to keep it open. This may cause some pressure or cramping. Images will be taken with the camera and will appear on a screen. A small tissue sample may be removed to be looked at under a microscope (biopsy). The tissue may be sent to a lab for testing if any signs of problems are found. If small polyps are found, they may be removed and  checked for cancer cells. When the procedure is finished, the tube will be removed. The procedure may vary among health care providers and hospitals. What happens after the procedure? Your blood pressure, heart rate, breathing rate, and blood oxygen level will be monitored until you leave the hospital or clinic. You may have a small amount of blood in your stool. You may pass gas and have mild cramping or bloating in your abdomen. This is caused by the air that was used to open your colon during the exam. If you were given a sedative during the procedure, it can affect you for several hours. Do not drive or operate machinery until your health care provider says that it is safe. It is up to you to get the results of your procedure. Ask your health care  provider, or the department that is doing the procedure, when your results will be ready. Summary A colonoscopy is a procedure to look at the entire large intestine. Follow instructions from your health care provider about eating and drinking before the procedure. If you were prescribed an oral bowel prep to clean out your colon, take it as told by your health care provider. During the colonoscopy, a flexible tube with a camera on its end is inserted into the anus and then passed into all parts of the large intestine. This information is not intended to replace advice given to you by your health care provider. Make sure you discuss any questions you have with your health care provider. Document Revised: 04/02/2022 Document Reviewed: 10/11/2020 Elsevier Patient Education  2024 Elsevier Inc.  Monitored Anesthesia Care, Care After The following information offers guidance on how to care for yourself after your procedure. Your health care provider may also give you more specific instructions. If you have problems or questions, contact your health care provider. What can I expect after the procedure? After the procedure, it is common to have: Tiredness. Little or no memory about what happened during or after the procedure. Impaired judgment when it comes to making decisions. Nausea or vomiting. Some trouble with balance. Follow these instructions at home: For the time period you were told by your health care provider:  Rest. Do not participate in activities where you could fall or become injured. Do not drive or use machinery. Do not drink alcohol. Do not take sleeping pills or medicines that cause drowsiness. Do not make important decisions or sign legal documents. Do not take care of children on your own. Medicines Take over-the-counter and prescription medicines only as told by your health care provider. If you were prescribed antibiotics, take them as told by your health care provider.  Do not stop using the antibiotic even if you start to feel better. Eating and drinking Follow instructions from your health care provider about what you may eat and drink. Drink enough fluid to keep your urine pale yellow. If you vomit: Drink clear fluids slowly and in small amounts as you are able. Clear fluids include water , ice chips, low-calorie sports drinks, and fruit juice that has water  added to it (diluted fruit juice). Eat light and bland foods in small amounts as you are able. These foods include bananas, applesauce, rice, lean meats, toast, and crackers. General instructions  Have a responsible adult stay with you for the time you are told. It is important to have someone help care for you until you are awake and alert. If you have sleep apnea, surgery and some medicines can increase your risk for breathing  problems. Follow instructions from your health care provider about wearing your sleep device: When you are sleeping. This includes during daytime naps. While taking prescription pain medicines, sleeping medicines, or medicines that make you drowsy. Do not use any products that contain nicotine or tobacco. These products include cigarettes, chewing tobacco, and vaping devices, such as e-cigarettes. If you need help quitting, ask your health care provider. Contact a health care provider if: You feel nauseous or vomit every time you eat or drink. You feel light-headed. You are still sleepy or having trouble with balance after 24 hours. You get a rash. You have a fever. You have redness or swelling around the IV site. Get help right away if: You have trouble breathing. You have new confusion after you get home. These symptoms may be an emergency. Get help right away. Call 911. Do not wait to see if the symptoms will go away. Do not drive yourself to the hospital. This information is not intended to replace advice given to you by your health care provider. Make sure you discuss  any questions you have with your health care provider. Document Revised: 07/16/2021 Document Reviewed: 07/16/2021 Elsevier Patient Education  2024 [employer].

## 2023-09-25 ENCOUNTER — Encounter (HOSPITAL_COMMUNITY): Payer: Self-pay

## 2023-09-25 ENCOUNTER — Encounter (HOSPITAL_COMMUNITY)
Admission: RE | Admit: 2023-09-25 | Discharge: 2023-09-25 | Disposition: A | Discharge status: Home / Self Care | Source: Ambulatory Visit | Attending: Internal Medicine | Admitting: Internal Medicine

## 2023-09-25 VITALS — BP 114/62 | HR 85 | Resp 18 | Ht 74.0 in | Wt 304.9 lb

## 2023-09-25 DIAGNOSIS — Z01812 Encounter for preprocedural laboratory examination: Secondary | ICD-10-CM | POA: Insufficient documentation

## 2023-09-25 DIAGNOSIS — Z01818 Encounter for other preprocedural examination: Secondary | ICD-10-CM | POA: Diagnosis present

## 2023-09-25 DIAGNOSIS — Z79899 Other long term (current) drug therapy: Secondary | ICD-10-CM | POA: Diagnosis not present

## 2023-09-25 HISTORY — DX: Sleep apnea, unspecified: G47.30

## 2023-09-25 LAB — BASIC METABOLIC PANEL WITH GFR
Anion gap: 8 (ref 5–15)
BUN: 15 mg/dL (ref 6–20)
CO2: 24 mmol/L (ref 22–32)
Calcium: 9.2 mg/dL (ref 8.9–10.3)
Chloride: 106 mmol/L (ref 98–111)
Creatinine, Ser: 1 mg/dL (ref 0.61–1.24)
GFR, Estimated: >60 mL/min (ref >60)
Glucose, Bld: 103 mg/dL — ABNORMAL HIGH (ref 70–99)
Potassium: 4.1 mmol/L (ref 3.5–5.1)
Sodium: 138 mmol/L (ref 135–145)

## 2023-09-26 ENCOUNTER — Ambulatory Visit (HOSPITAL_COMMUNITY)
Admission: RE | Admit: 2023-09-26 | Discharge: 2023-09-26 | Disposition: A | Discharge status: Home / Self Care | Attending: Internal Medicine | Admitting: Internal Medicine

## 2023-09-26 ENCOUNTER — Ambulatory Visit (HOSPITAL_COMMUNITY): Admitting: Anesthesiology

## 2023-09-26 ENCOUNTER — Encounter (HOSPITAL_COMMUNITY): Payer: Self-pay | Admitting: Internal Medicine

## 2023-09-26 ENCOUNTER — Encounter (HOSPITAL_COMMUNITY)
Admission: RE | Disposition: A | Discharge status: Home / Self Care | Payer: Self-pay | Source: Home / Self Care | Attending: Internal Medicine

## 2023-09-26 ENCOUNTER — Other Ambulatory Visit: Payer: Self-pay

## 2023-09-26 DIAGNOSIS — K219 Gastro-esophageal reflux disease without esophagitis: Secondary | ICD-10-CM | POA: Insufficient documentation

## 2023-09-26 DIAGNOSIS — J449 Chronic obstructive pulmonary disease, unspecified: Secondary | ICD-10-CM | POA: Diagnosis not present

## 2023-09-26 DIAGNOSIS — I110 Hypertensive heart disease with heart failure: Secondary | ICD-10-CM | POA: Diagnosis not present

## 2023-09-26 DIAGNOSIS — Z1211 Encounter for screening for malignant neoplasm of colon: Secondary | ICD-10-CM

## 2023-09-26 DIAGNOSIS — J4489 Other specified chronic obstructive pulmonary disease: Secondary | ICD-10-CM | POA: Insufficient documentation

## 2023-09-26 DIAGNOSIS — E669 Obesity, unspecified: Secondary | ICD-10-CM | POA: Insufficient documentation

## 2023-09-26 DIAGNOSIS — G4730 Sleep apnea, unspecified: Secondary | ICD-10-CM | POA: Insufficient documentation

## 2023-09-26 DIAGNOSIS — K648 Other hemorrhoids: Secondary | ICD-10-CM

## 2023-09-26 DIAGNOSIS — I10 Essential (primary) hypertension: Secondary | ICD-10-CM | POA: Diagnosis not present

## 2023-09-26 DIAGNOSIS — Z6839 Body mass index (BMI) 39.0-39.9, adult: Secondary | ICD-10-CM | POA: Insufficient documentation

## 2023-09-26 DIAGNOSIS — K644 Residual hemorrhoidal skin tags: Secondary | ICD-10-CM | POA: Diagnosis not present

## 2023-09-26 DIAGNOSIS — I4891 Unspecified atrial fibrillation: Secondary | ICD-10-CM | POA: Insufficient documentation

## 2023-09-26 HISTORY — PX: COLONOSCOPY: SHX5424

## 2023-09-26 LAB — GLUCOSE, CAPILLARY: Glucose-Capillary: 101 mg/dL — ABNORMAL HIGH (ref 70–99)

## 2023-09-26 SURGERY — COLONOSCOPY
Anesthesia: General

## 2023-09-26 MED ORDER — LIDOCAINE 2% (20 MG/ML) 5 ML SYRINGE
INTRAMUSCULAR | Status: DC | PRN
  Administered 2023-09-26: 60 mg via INTRAVENOUS

## 2023-09-26 MED ORDER — PROPOFOL 500 MG/50ML IV EMUL
INTRAVENOUS | Status: DC | PRN
  Administered 2023-09-26: 150 ug/kg/min via INTRAVENOUS
  Administered 2023-09-26: 100 mg via INTRAVENOUS

## 2023-09-26 MED ORDER — LACTATED RINGERS IV SOLN: INTRAVENOUS | Status: DC

## 2023-09-26 NOTE — Anesthesia Preprocedure Evaluation (Signed)
 Anesthesia Evaluation  Patient identified by MRN, date of birth, ID band Patient awake    Reviewed: Allergy & Precautions, H&P , NPO status , Patient's Chart, lab work & pertinent test results, reviewed documented beta blocker date and time   Airway Mallampati: II  TM Distance: >3 FB Neck ROM: full    Dental no notable dental hx.    Pulmonary asthma , sleep apnea , COPD   Pulmonary exam normal breath sounds clear to auscultation       Cardiovascular Exercise Tolerance: Good hypertension,  Rhythm:regular Rate:Normal     Neuro/Psych  PSYCHIATRIC DISORDERS Anxiety Depression    negative neurological ROS     GI/Hepatic Neg liver ROS, hiatal hernia,GERD  ,,  Endo/Other  negative endocrine ROS    Renal/GU negative Renal ROS  negative genitourinary   Musculoskeletal   Abdominal   Peds  Hematology negative hematology ROS (+)   Anesthesia Other Findings   Reproductive/Obstetrics negative OB ROS                              Anesthesia Physical Anesthesia Plan  ASA: 3  Anesthesia Plan: General   Post-op Pain Management:    Induction:   PONV Risk Score and Plan: Propofol  infusion  Airway Management Planned:   Additional Equipment:   Intra-op Plan:   Post-operative Plan:   Informed Consent: I have reviewed the patients History and Physical, chart, labs and discussed the procedure including the risks, benefits and alternatives for the proposed anesthesia with the patient or authorized representative who has indicated his/her understanding and acceptance.     Dental Advisory Given  Plan Discussed with: CRNA  Anesthesia Plan Comments:         Anesthesia Quick Evaluation

## 2023-09-26 NOTE — Op Note (Signed)
 [facility] Patient Name: [name] Procedure Date: 09/26/2023 10:26 AM MRN: [id number] Date of Birth: 1965/12/21 Attending MD: Carlin POUR. Cindie , [place], [id number] CSN: [id number] Age: 58 Admit Type: Outpatient Procedure:                Colonoscopy Indications:              Screening for colorectal malignant neoplasm Providers:                Carlin POUR. Cindie, DO, Devere Lodge, Crystal Page,                            Kristine L. Shirlean Balm, Technician Referring MD:              Medicines:                See the Anesthesia note for documentation of the                            administered medications Complications:            No immediate complications. Estimated Blood Loss:     Estimated blood loss: none. Procedure:                Pre-Anesthesia Assessment:                           - The anesthesia plan was to use monitored                            anesthesia care (MAC).                           After obtaining informed consent, the colonoscope                            was passed under direct vision. Throughout the                            procedure, the patient's blood pressure, pulse, and                            oxygen saturations were monitored continuously. The                            [id number]-HQ190L ([id number]) scope was introduced through                            the anus and advanced to the the cecum, identified                            by appendiceal orifice and ileocecal valve. The                            colonoscopy was performed without difficulty. The                            patient tolerated the  procedure well. The quality                            of the bowel preparation was evaluated using the                            BBPS [facility] Bowel Preparation Scale) with scores                            of: Right Colon = 3, Transverse Colon = 3 and Left                            Colon = 3 (entire mucosa seen well with no residual                             staining, small fragments of stool or opaque                            liquid). The total BBPS score equals 9. Scope In: 11:18:21 AM Scope Out: 11:36:46 AM Scope Withdrawal Time: 0 hours 12 minutes 46 seconds  Total Procedure Duration: 0 hours 18 minutes 25 seconds  Findings:      Hemorrhoids were found on perianal exam.      Non-bleeding internal hemorrhoids were found.      The exam was otherwise without abnormality. Impression:               - Hemorrhoids found on perianal exam.                           - Non-bleeding internal hemorrhoids.                           - The examination was otherwise normal.                           - No specimens collected. Moderate Sedation:      Per Anesthesia Care Recommendation:           - Patient has a contact number available for                            emergencies. The signs and symptoms of potential                            delayed complications were discussed with the                            patient. Return to normal activities tomorrow.                            Written discharge instructions were provided to the                            patient.                           -  Resume previous diet.                           - Continue present medications.                           - Repeat colonoscopy in 10 years for screening                            purposes.                           - Return to GI clinic in 3 months. Procedure Code(s):        --- Professional ---                           H9878, Colorectal cancer screening; colonoscopy on                            individual not meeting criteria for high risk Diagnosis Code(s):        --- Professional ---                           Z12.11, Encounter for screening for malignant                            neoplasm of colon                           K64.8, Other hemorrhoids CPT copyright 2022 American Medical Association. All rights reserved. The codes documented  in this report are preliminary and upon coder review may  be revised to meet current compliance requirements. Carlin POUR. Cindie, DO Carlin POUR. Cindie, DO 09/26/2023 12:09:47 PM This report has been signed electronically. Number of Addenda: 0

## 2023-09-26 NOTE — Transfer of Care (Signed)
 Immediate Anesthesia Transfer of Care Note  Patient: [name]  Procedure(s) Performed: COLONOSCOPY  Patient Location: Short Stay  Anesthesia Type:General  Level of Consciousness: drowsy  Airway & Oxygen Therapy: Patient Spontanous Breathing  Post-op Assessment: Report given to RN and Post -op Vital signs reviewed and stable  Post vital signs: Reviewed and stable  Last Vitals:  Vitals Value Taken Time  BP 123/75 09/26/23 11:42  Temp 36.7 C 09/26/23 11:42  Pulse 88 09/26/23 11:42  Resp 18 09/26/23 11:42  SpO2 96 % 09/26/23 11:42    Last Pain:  Vitals:   09/26/23 1142  TempSrc: Oral  PainSc: 0-No pain      Patients Stated Pain Goal: 7 (09/26/23 1142)  Complications: No notable events documented.

## 2023-09-26 NOTE — H&P (Signed)
 Primary Care Physician:  Marvine Rush, MD Primary Gastroenterologist:  Dr. Cindie  Pre-Procedure History & Physical: HPI:  [name] is a 58 y.o. male is here for a colonoscopy for colon cancer screening purposes.    Past Medical History:  Diagnosis Date   Anal fissure    Anxiety    Asthma    mild per patient. 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    HEMORRHOID BANDING N/A 04/12/2013   Procedure: GLENICE CAI;  Surgeon: Margo LITTIE Haddock, MD;  Location: [facility];  Service: Endoscopy;  Laterality: N/A;   NERVE, TENDON AND ARTERY REPAIR Left 02/21/2017   Procedure: WOUND EXPLORATION LEFT INDEX, LONG, AND RING FINGERS;  NERVE, TENDON AND ARTERY REPAIR;  Surgeon: Murrell Drivers, MD;  Location: [facility];  Service: Orthopedics;  Laterality: Left;    Prior to Admission medications   Medication Sig Start Date End Date Taking? Authorizing Provider  buPROPion (WELLBUTRIN XL) 150 MG 24 hr tablet Take 150 mg by mouth daily. 03/26/23  Yes [provider]  dapagliflozin   propanediol (FARXIGA ) 10 MG TABS tablet TAKE 1 TABLET BY MOUTH DAILY BEFORE BREAKFAST 08/12/23  Yes Branch, Dorn FALCON, MD  escitalopram (LEXAPRO) 10 MG tablet Take 10 mg by mouth daily. 09/14/18  Yes [provider]  metoprolol  succinate (TOPROL -XL) 100 MG 24 hr tablet TAKE 1 TABLET BY MOUTH EVERY MORNING and TAKE 1 TABLET AT BEDTIME WITH OR IMMEDIATELY FOLLOWING A MEAL 04/10/23  Yes Branch, Dorn FALCON, MD  Vitamin D, Cholecalciferol, 25 MCG (1000 UT) TABS Take 1,000 Units by mouth daily.    Yes [provider]  XARELTO  20 MG TABS tablet TAKE 1 TABLET BY MOUTH EVERY DAY WITH SUPPER 04/10/23  Yes Branch, Dorn FALCON, MD  acetaminophen  (TYLENOL ) 500 MG tablet Take 1,000 mg by mouth every 6 (six) hours as needed for moderate pain.     [provider]  docusate sodium (COLACE) 50 MG capsule Take 50 mg by mouth 2 (two) times daily.    [provider]  hydrocortisone  (ANUSOL -HC) 2.5 % rectal cream Place 1 Application rectally 2 (two) times daily. 07/14/23   Rudy Josette RAMAN, PA-C  Magnesium 250 MG TABS Take 300 mg by mouth daily.    [provider]  Omega-3 Fatty Acids (FISH OIL) 1000 MG CAPS Take 2 capsules by mouth every morning.    [provider]  sacubitril -valsartan  (ENTRESTO ) 97-103 MG Take 1 tablet by mouth 2 (two) times daily.    [provider]  spironolactone  (ALDACTONE ) 25 MG tablet Take 1 tablet (25 mg total) by mouth daily. 02/12/23   Alvan Dorn FALCON, MD    Allergies as of 08/28/2023   (No Known Allergies)    Family History  Problem Relation Age of Onset   Liver disease Brother        ? etiology   Colon cancer Neg Hx     Social History   Socioeconomic History   Marital status: Married    Spouse name: Not on file   Number of children: 2   Years of education: Not on file   Highest education level: Not on file  Occupational History   Occupation: Child psychotherapist  Tobacco Use   Smoking status: Never   Smokeless tobacco:  Never  Vaping Use   Vaping status: Never Used  Substance and Sexual Activity   Alcohol use: Not Currently    Alcohol/week: 0.0 standard drinks of alcohol    Comment: socially   Drug use: No   Sexual activity: Yes    Birth control/protection: None  Other Topics Concern   Not on file  Social History Narrative   Not on file   Social Drivers of Health   Financial Resource Strain: Not on file  Food Insecurity: Not on file  Transportation Needs: Not on file  Physical Activity: Not on file  Stress: Not on file  Social Connections: Not on file  Intimate Partner Violence: Not on file    Review of Systems: See HPI, otherwise negative ROS  Physical Exam: Vital signs in last 24 hours: Temp:  [97.9 F (36.6 C)] 97.9 F (36.6 C) (07/25 1012) Pulse Rate:  [87] 87 (07/25 1012) Resp:  [10] 10 (07/25 1012) BP: (138)/(89) 138/89 (07/25 1012) SpO2:  [100 %] 100 % (07/25 1012) Weight:  [138.8 kg] 138.8 kg (07/25 1012)   General:   Alert,  Well-developed, well-nourished, pleasant and cooperative in NAD Head:  Normocephalic and atraumatic. Eyes:  Sclera clear, no icterus.   Conjunctiva pink. Ears:  Normal auditory acuity. Nose:  No deformity, discharge,  or lesions. Msk:  Symmetrical without gross deformities. Normal posture. Extremities:  Without clubbing or edema. Neurologic:  Alert and  oriented x4;  grossly normal neurologically. Skin:  Intact without significant lesions or rashes. Psych:  Alert and cooperative. Normal mood and affect.  Impression/Plan: [name] is here for a colonoscopy to be performed for colon cancer screening purposes.  The risks of the procedure including infection, bleed, or perforation as well as benefits, limitations, alternatives and imponderables have been reviewed with the patient. Questions have been answered. All parties agreeable.

## 2023-09-26 NOTE — Discharge Instructions (Addendum)
  Colonoscopy Discharge Instructions  Read the instructions outlined below and refer to this sheet in the next few weeks. These discharge instructions provide you with general information on caring for yourself after you leave the hospital. Your doctor may also give you specific instructions. While your treatment has been planned according to the most current medical practices available, unavoidable complications occasionally occur.   ACTIVITY You may resume your regular activity, but move at a slower pace for the next 24 hours.  Take frequent rest periods for the next 24 hours.  Walking will help get rid of the air and reduce the bloated feeling in your belly (abdomen).  No driving for 24 hours (because of the medicine (anesthesia) used during the test).   Do not sign any important legal documents or operate any machinery for 24 hours (because of the anesthesia used during the test).  NUTRITION Drink plenty of fluids.  You may resume your normal diet as instructed by your doctor.  Begin with a light meal and progress to your normal diet. Heavy or fried foods are harder to digest and may make you feel sick to your stomach (nauseated).  Avoid alcoholic beverages for 24 hours or as instructed.  MEDICATIONS You may resume your normal medications unless your doctor tells you otherwise.  WHAT YOU CAN EXPECT TODAY Some feelings of bloating in the abdomen.  Passage of more gas than usual.  Spotting of blood in your stool or on the toilet paper.  IF YOU HAD POLYPS REMOVED DURING THE COLONOSCOPY: No aspirin  products for 7 days or as instructed.  No alcohol for 7 days or as instructed.  Eat a soft diet for the next 24 hours.  FINDING OUT THE RESULTS OF YOUR TEST Not all test results are available during your visit. If your test results are not back during the visit, make an appointment with your caregiver to find out the results. Do not assume everything is normal if you have not heard from your  caregiver or the medical facility. It is important for you to follow up on all of your test results.  SEEK IMMEDIATE MEDICAL ATTENTION IF: You have more than a spotting of blood in your stool.  Your belly is swollen (abdominal distention).  You are nauseated or vomiting.  You have a temperature over 101.  You have abdominal pain or discomfort that is severe or gets worse throughout the day.   Your colonoscopy was relatively unremarkable.  I did not find any polyps or evidence of colon cancer.  I recommend repeating colonoscopy in 10 years for colon cancer screening purposes.  You do have internal hemorrhoids. I would recommend increasing fiber in your diet or adding OTC Benefiber/Metamucil. Be sure to drink at least 4 to 6 glasses of water  daily. Follow-up with GI in 8 weeks.   I hope you have a great rest of your week!  Rolando Cliche. Mordechai April, D.O. Gastroenterology and Hepatology [facility]

## 2023-09-27 NOTE — Anesthesia Postprocedure Evaluation (Signed)
 Anesthesia Post Note  Patient: [name]  Procedure(s) Performed: COLONOSCOPY  Patient location during evaluation: Phase II Anesthesia Type: General Level of consciousness: awake Pain management: pain level controlled Vital Signs Assessment: post-procedure vital signs reviewed and stable Respiratory status: spontaneous breathing and respiratory function stable Cardiovascular status: blood pressure returned to baseline and stable Postop Assessment: no headache and no apparent nausea or vomiting Anesthetic complications: no Comments: Late entry   No notable events documented.   Last Vitals:  Vitals:   09/26/23 1012 09/26/23 1142  BP: 138/89 123/75  Pulse: 87 88  Resp: 10 18  Temp: 36.6 C 36.7 C  SpO2: 100% 96%    Last Pain:  Vitals:   09/26/23 1142  TempSrc: Oral  PainSc: 0-No pain                 Yvonna JINNY Bosworth

## 2023-09-29 ENCOUNTER — Encounter (HOSPITAL_COMMUNITY): Payer: Self-pay | Admitting: Internal Medicine

## 2023-10-10 ENCOUNTER — Other Ambulatory Visit: Payer: Self-pay | Admitting: Cardiology

## 2023-10-10 DIAGNOSIS — I4811 Longstanding persistent atrial fibrillation: Secondary | ICD-10-CM

## 2023-10-10 NOTE — Telephone Encounter (Signed)
 Prescription refill request for Xarelto  received.  Indication: Afib  Last office visit: 04/16/23 Sam)  Weight: 138.8kg Age: 58 Scr: 1.00 (09/25/23)  CrCl: 160.57ml/min  Appropriate dose. Refill sent.

## 2023-11-09 ENCOUNTER — Other Ambulatory Visit: Payer: Self-pay | Admitting: Cardiology

## 2023-11-13 ENCOUNTER — Encounter: Payer: Self-pay | Admitting: Internal Medicine

## 2023-11-20 ENCOUNTER — Telehealth: Payer: Self-pay | Admitting: Cardiology

## 2023-11-20 NOTE — Telephone Encounter (Signed)
    Primary Cardiologist: Alvan Carrier, MD  Chart reviewed as part of pre-operative protocol coverage. Simple dental extractions are considered low risk procedures per guidelines and generally do not require any specific cardiac clearance. It is also generally accepted that for simple extractions and dental cleanings, there is no need to interrupt blood thinner therapy.   SBE prophylaxis is not required for the patient.  I will route this recommendation to the requesting party via Epic fax function and remove from pre-op pool.  Please call with questions.  Orren LOISE Fabry, PA-C 11/20/2023, 2:53 PM

## 2023-11-20 NOTE — Telephone Encounter (Signed)
   Pre-operative Risk Assessment    Patient Name: [name]  DOB: 10/07/1965 MRN: [id number]   Date of last office visit: 04/16/23 afib clinic; 03/20/23 Miriam, NP  Date of next office visit: nothing   Request for Surgical Clearance    Procedure:  Dental Extraction - Amount of Teeth to be Pulled:  1  Date of Surgery:  Clearance 11/20/23                                Surgeon:  Vonzell, DDS  Surgeon's Group or Practice Name:  [facility]  Phone number:  [phone]  Fax number:  [phone]    Type of Clearance Requested:   - Medical  - Pharmacy:  [facility]  (Xarelto )     Type of Anesthesia:  Local    Additional requests/questions:    Bonney Sheffield JONELLE Lenora   11/20/2023, 2:38 PM

## 2024-01-30 ENCOUNTER — Other Ambulatory Visit: Payer: Self-pay | Admitting: Cardiology

## 2024-02-03 ENCOUNTER — Other Ambulatory Visit: Payer: Self-pay | Admitting: Cardiology

## 2024-02-10 MED ORDER — SACUBITRIL-VALSARTAN 97-103 MG PO TABS: 1.0000 | ORAL_TABLET | Freq: Two times a day (BID) | ORAL | 1 refills | Status: AC

## 2024-04-19 ENCOUNTER — Ambulatory Visit: Admitting: Nurse Practitioner

## 2024-05-19 ENCOUNTER — Ambulatory Visit: Admitting: Gastroenterology
